# Patient Record
Sex: Male | Born: 1975 | Race: White | Hispanic: No | Marital: Single
Health system: Southern US, Community
[De-identification: ages and names within clinical notes are randomized; demographics above are authoritative.]

---

## 2016-05-10 ENCOUNTER — Inpatient Hospital Stay
Admission: AD | Admit: 2016-05-10 | Discharge: 2016-06-30 | Disposition: E | Payer: Medicaid Other | Source: Ambulatory Visit | Attending: Internal Medicine | Admitting: Internal Medicine

## 2016-05-10 DIAGNOSIS — Z992 Dependence on renal dialysis: Secondary | ICD-10-CM

## 2016-05-10 DIAGNOSIS — I82C12 Acute embolism and thrombosis of left internal jugular vein: Secondary | ICD-10-CM

## 2016-05-10 DIAGNOSIS — Z4659 Encounter for fitting and adjustment of other gastrointestinal appliance and device: Secondary | ICD-10-CM

## 2016-05-10 DIAGNOSIS — Z0189 Encounter for other specified special examinations: Secondary | ICD-10-CM

## 2016-05-10 DIAGNOSIS — Z978 Presence of other specified devices: Secondary | ICD-10-CM

## 2016-05-10 DIAGNOSIS — T8131XA Disruption of external operation (surgical) wound, not elsewhere classified, initial encounter: Secondary | ICD-10-CM

## 2016-05-10 DIAGNOSIS — Z931 Gastrostomy status: Secondary | ICD-10-CM

## 2016-05-10 DIAGNOSIS — T8579XA Infection and inflammatory reaction due to other internal prosthetic devices, implants and grafts, initial encounter: Secondary | ICD-10-CM

## 2016-05-10 DIAGNOSIS — N186 End stage renal disease: Secondary | ICD-10-CM

## 2016-05-10 DIAGNOSIS — J189 Pneumonia, unspecified organism: Secondary | ICD-10-CM

## 2016-05-10 DIAGNOSIS — J9690 Respiratory failure, unspecified, unspecified whether with hypoxia or hypercapnia: Secondary | ICD-10-CM

## 2016-05-10 DIAGNOSIS — K651 Peritoneal abscess: Secondary | ICD-10-CM

## 2016-05-10 NOTE — Consult Note (Signed)
Date: 05/16/2016                  Patient Name:  Steven Hines  MRN: [id number]  DOB: [birth year]  Age / Sex: 41 y.o., male         PCP: Pcp Not In System                 Service Requesting Consult: IM [facility]. Dr Christella Hartigan                 Reason for Consult: ESRD            History of Present Illness: Patient is a 41 y.o. male with medical problems of ESRD, DM-2, PVD, HTN, who was admitted to [facility] on 05/26/2016 for ongoing care. Patient has been on dialysis for 4 years.  Lately he was doing PD. He was admitted to outside hospital post fall as a result of hypoglycemia. He suffered from fracture of Tibia and Fibula of rt leg. It is being managed conservatively.   He was also noted to have abdominal abscess by CT scan. Two intra-abdominal drains were placed and PD cathter was removed. He was started on HD  Last HD was this morning. Nephrology asked to evaluate for HD  Medications: Outpatient medications: No prescriptions prior to admission.    Current medications: No current facility-administered medications for this encounter.    reviewed medex   Allergies: Allergies not on file    Past Medical History: No past medical history on file.   Past Surgical History: No past surgical history on file.   Family History: No family history on file.   Social History: Social History   Social History  . Marital status: Single    Spouse name: N/A  . Number of children: N/A  . Years of education: N/A   Occupational History  . Not on file.   Social History Main Topics  . Smoking status: Not on file  . Smokeless tobacco: Not on file  . Alcohol use Not on file  . Drug use: Unknown  . Sexual activity: Not on file   Other Topics Concern  . Not on file   Social History Narrative  . No narrative on file     Review of Systems: Gen: no fever chills, + weakness HEENT: denies acute c/o CV: denies chest pain or SOB/ no h/o MI Resp: denies acute c/o GI: no c/o GU : no  c/o.   MS:  Rt leg fracture Derm:  rt toe ischemic changes, Psych: no c/o Heme: no c/o Neuro: no c/o  Endocrine. No acute c/o   Vital Signs: T 97.8 BP 165/99  RR 22  HR 95  sats 94% 2 L [place]  Weight trends: There were no vitals filed for this visit.  Physical Exam: General:  NAD, laying in bed  HEENT Periorbital edema, anicteric, moist oral mucus membranes  Neck:  supple  Lungs: Coarse breath sounds, normal edffort, 2 L [place] O2  Heart::  regular, no rub  Abdomen: Soft, distended, LLQ drain, RUQ drain  Extremities:  no edema  Neurologic: Alert, answers a few questions  Skin: Rt shin large necrotic area, rt big toe ischemic changes, tip of rt middle finger ischemic changes  Access:   Foley:        Lab results: Basic Metabolic Panel: No results for input(s): NA, K, CL, CO2, GLUCOSE, BUN, CREATININE, CALCIUM, MG, PHOS in the last 168 hours.  Liver Function Tests: No results for  input(s): AST, ALT, ALKPHOS, BILITOT, PROT, ALBUMIN in the last 168 hours. No results for input(s): LIPASE, AMYLASE in the last 168 hours. No results for input(s): AMMONIA in the last 168 hours.  CBC: No results for input(s): WBC, NEUTROABS, HGB, HCT, MCV, PLT in the last 168 hours.  Cardiac Enzymes: No results for input(s): CKTOTAL, TROPONINI in the last 168 hours.  BNP: Invalid input(s): POCBNP  CBG: No results for input(s): GLUCAP in the last 168 hours.  Microbiology: No results found for this or any previous visit (from the past 720 hour(s)).   Coagulation Studies: No results for input(s): LABPROT, INR in the last 72 hours.  Urinalysis: No results for input(s): COLORURINE, LABSPEC, PHURINE, GLUCOSEU, HGBUR, BILIRUBINUR, KETONESUR, PROTEINUR, UROBILINOGEN, NITRITE, LEUKOCYTESUR in the last 72 hours.  Invalid input(s): APPERANCEUR      Imaging:  No results found.   Assessment & Plan: Pt is a 41 y.o. WM male with ESRD, on dialysis for 4 years, DM-2, HTN, severe PVD, was admitted  on 04/30/2016. Presented with rt tibia fib fracture. Conservative management. Hospital course complicated by intra-abdominal abscess; PD catheter has been removed  1. ESRD- now HD; MWF 2. AOCKD 3. SHPTH 4. DM-2 with CKD 5. HTN  Plan: Dialysis MWF Aranesp Iron studies Abx as per Primary team Management of ischemia as per primary team Monitor phos with dialysis  Next HD on Friday

## 2016-05-11 ENCOUNTER — Other Ambulatory Visit (HOSPITAL_COMMUNITY): Payer: Medicaid Other

## 2016-05-11 LAB — CBC
HCT: 30.8 % — ABNORMAL LOW (ref 39.0–52.0)
HEMOGLOBIN: 9.4 g/dL — AB (ref 13.0–17.0)
MCH: 28.1 pg (ref 26.0–34.0)
MCHC: 30.5 g/dL (ref 30.0–36.0)
MCV: 91.9 fL (ref 78.0–100.0)
Platelets: 415 10*3/uL — ABNORMAL HIGH (ref 150–400)
RBC: 3.35 MIL/uL — AB (ref 4.22–5.81)
RDW: 19.7 % — ABNORMAL HIGH (ref 11.5–15.5)
WBC: 17.2 10*3/uL — ABNORMAL HIGH (ref 4.0–10.5)

## 2016-05-11 LAB — COMPREHENSIVE METABOLIC PANEL
ALT: 13 U/L — ABNORMAL LOW (ref 17–63)
AST: 26 U/L (ref 15–41)
Albumin: 1.5 g/dL — ABNORMAL LOW (ref 3.5–5.0)
Alkaline Phosphatase: 181 U/L — ABNORMAL HIGH (ref 38–126)
Anion gap: 12 (ref 5–15)
BILIRUBIN TOTAL: 0.3 mg/dL (ref 0.3–1.2)
BUN: 30 mg/dL — ABNORMAL HIGH (ref 6–20)
CHLORIDE: 95 mmol/L — AB (ref 101–111)
CO2: 29 mmol/L (ref 22–32)
CREATININE: 4.7 mg/dL — AB (ref 0.61–1.24)
Calcium: 8.8 mg/dL — ABNORMAL LOW (ref 8.9–10.3)
GFR, EST AFRICAN AMERICAN: 16 mL/min — AB (ref >60)
GFR, EST NON AFRICAN AMERICAN: 14 mL/min — AB (ref >60)
GLUCOSE: 55 mg/dL — AB (ref 65–99)
Potassium: 4.2 mmol/L (ref 3.5–5.1)
Sodium: 136 mmol/L (ref 135–145)
Total Protein: 6.3 g/dL — ABNORMAL LOW (ref 6.5–8.1)

## 2016-05-11 LAB — BASIC METABOLIC PANEL
ANION GAP: 10 (ref 5–15)
BUN: 33 mg/dL — ABNORMAL HIGH (ref 6–20)
CHLORIDE: 95 mmol/L — AB (ref 101–111)
CO2: 30 mmol/L (ref 22–32)
Calcium: 8.7 mg/dL — ABNORMAL LOW (ref 8.9–10.3)
Creatinine, Ser: 5.06 mg/dL — ABNORMAL HIGH (ref 0.61–1.24)
GFR calc non Af Amer: 13 mL/min — ABNORMAL LOW (ref >60)
GFR, EST AFRICAN AMERICAN: 15 mL/min — AB (ref >60)
Glucose, Bld: 101 mg/dL — ABNORMAL HIGH (ref 65–99)
Potassium: 4.3 mmol/L (ref 3.5–5.1)
Sodium: 135 mmol/L (ref 135–145)

## 2016-05-11 LAB — PROTIME-INR
INR: 1.18
Prothrombin Time: 15.1 seconds (ref 11.4–15.2)

## 2016-05-12 LAB — CBC
HEMATOCRIT: 28.7 % — AB (ref 39.0–52.0)
Hemoglobin: 8.7 g/dL — ABNORMAL LOW (ref 13.0–17.0)
MCH: 27.9 pg (ref 26.0–34.0)
MCHC: 30.3 g/dL (ref 30.0–36.0)
MCV: 92 fL (ref 78.0–100.0)
Platelets: 411 10*3/uL — ABNORMAL HIGH (ref 150–400)
RBC: 3.12 MIL/uL — ABNORMAL LOW (ref 4.22–5.81)
RDW: 19.8 % — AB (ref 11.5–15.5)
WBC: 16.5 10*3/uL — AB (ref 4.0–10.5)

## 2016-05-12 LAB — RENAL FUNCTION PANEL
Albumin: 1.5 g/dL — ABNORMAL LOW (ref 3.5–5.0)
Anion gap: 11 (ref 5–15)
BUN: 38 mg/dL — ABNORMAL HIGH (ref 6–20)
CHLORIDE: 93 mmol/L — AB (ref 101–111)
CO2: 29 mmol/L (ref 22–32)
Calcium: 8.8 mg/dL — ABNORMAL LOW (ref 8.9–10.3)
Creatinine, Ser: 5.76 mg/dL — ABNORMAL HIGH (ref 0.61–1.24)
GFR calc Af Amer: 13 mL/min — ABNORMAL LOW (ref >60)
GFR, EST NON AFRICAN AMERICAN: 11 mL/min — AB (ref >60)
Glucose, Bld: 71 mg/dL (ref 65–99)
POTASSIUM: 4.6 mmol/L (ref 3.5–5.1)
Phosphorus: 6.2 mg/dL — ABNORMAL HIGH (ref 2.5–4.6)
Sodium: 133 mmol/L — ABNORMAL LOW (ref 135–145)

## 2016-05-12 LAB — IRON AND TIBC
IRON: 20 ug/dL — AB (ref 45–182)
SATURATION RATIOS: 16 % — AB (ref 17.9–39.5)
TIBC: 125 ug/dL — ABNORMAL LOW (ref 250–450)
UIBC: 105 ug/dL

## 2016-05-12 LAB — HEPATITIS B CORE ANTIBODY, TOTAL: HEP B C TOTAL AB: NEGATIVE

## 2016-05-12 LAB — HEPATITIS B SURFACE ANTIBODY,QUALITATIVE: Hep B S Ab: REACTIVE

## 2016-05-12 LAB — HEPATITIS B SURFACE ANTIGEN: HEP B S AG: NEGATIVE

## 2016-05-12 LAB — TRANSFERRIN: Transferrin: 89 mg/dL — ABNORMAL LOW (ref 180–329)

## 2016-05-12 LAB — FERRITIN: Ferritin: 658 ng/mL — ABNORMAL HIGH (ref 24–336)

## 2016-05-12 NOTE — Progress Notes (Signed)
  Subjective:  Patient is doing fair No acute complaints dialysis to be done later today   Objective:  Vital signs in last 24 hours:   T 97.6  P 86  R 20  bp 157/98    Intake/Output:   No intake or output data in the 24 hours ending 05/12/16 1635   Physical Exam: General: Sitting up in bed, NAD  HEENT Conjunctival edema  Neck supple  Pulm/lungs Coarse breath sounds, [place] O2  CVS/Heart Regular, no rub  Abdomen:  Soft, mildly distended, LLQ drains, RUQ drain  Extremities: Rt leg bandaged  Neurologic: Alert, oriented  Skin: Rt middle finger tip and Rt big toe ischemic changes  Access: Rt IJ PC       Basic Metabolic Panel:   Recent Labs Lab 05/11/16 0617 05/11/16 1457 05/12/16 0539  NA 136 135 133*  K 4.2 4.3 4.6  CL 95* 95* 93*  CO2 GLUCOSE 55* 101* 71  BUN 30* 33* 38*  CREATININE 4.70* 5.06* 5.76*  CALCIUM 8.8* 8.7* 8.8*  PHOS  --   --  6.2*     CBC:  Recent Labs Lab 05/11/16 0617 05/12/16 0539  WBC 17.2* 16.5*  HGB 9.4* 8.7*  HCT 30.8* 28.7*  MCV 91.9 92.0  PLT 415* 411*      Microbiology:  No results found for this or any previous visit (from the past 720 hour(s)).  Coagulation Studies:  Recent Labs  05/11/16 0617  LABPROT 15.1  INR 1.18    Urinalysis: No results for input(s): COLORURINE, LABSPEC, PHURINE, GLUCOSEU, HGBUR, BILIRUBINUR, KETONESUR, PROTEINUR, UROBILINOGEN, NITRITE, LEUKOCYTESUR in the last 72 hours.  Invalid input(s): APPERANCEUR    Imaging: Dg Chest Port 1 View  Result Date: 05/11/2016 CLINICAL DATA:  Respiratory failure, end-stage renal disease, diabetes. EXAM: PORTABLE CHEST 1 VIEW COMPARISON:  None in PACs FINDINGS: The lungs are reasonably well inflated. There is a moderate size left pleural effusion with smaller right pleural effusion. The interstitial markings are both lungs are increased. The pulmonary vascularity is engorged. The cardiac silhouette is top-normal in size. The retrocardiac region on  the left is dense. There is calcification in the wall of the aortic arch. The dialysis catheter tip projects over the junction of the middle and distal thirds of the SVC. IMPRESSION: CHF with interstitial edema and bilateral pleural effusions. Probable left lower lobe atelectasis. Thoracic aortic atherosclerosis. Electronically Signed   By: David  [place] M.D.   On: 05/11/2016 07:24     Medications:       Assessment/ Plan:  41 y.o.White  male with ESRD, on dialysis for 4 years, DM-2, HTN, severe PVD, was admitted on 05-22-16. Presented with rt tibia fib fracture. Conservative management. Hospital course complicated by intra-abdominal abscess; PD catheter has been removed  1. ESRD- now HD; MWF 2. AOCKD 3. SHPTH 4. DM-2 with CKD 5. HTN  Plan: Dialysis MWF Aranesp 60 weekly Iron studies Abx as per Primary team Management of ischemia as per primary team Monitor phos with dialysis sevelamer 2400 TID    LOS: 0 Steven Hines 4/13/20184:35 PM

## 2016-05-13 LAB — CBC
HCT: 26.2 % — ABNORMAL LOW (ref 39.0–52.0)
HEMOGLOBIN: 8.1 g/dL — AB (ref 13.0–17.0)
MCH: 28.6 pg (ref 26.0–34.0)
MCHC: 30.9 g/dL (ref 30.0–36.0)
MCV: 92.6 fL (ref 78.0–100.0)
PLATELETS: 350 10*3/uL (ref 150–400)
RBC: 2.83 MIL/uL — AB (ref 4.22–5.81)
RDW: 20 % — ABNORMAL HIGH (ref 11.5–15.5)
WBC: 15.2 10*3/uL — AB (ref 4.0–10.5)

## 2016-05-15 LAB — RENAL FUNCTION PANEL
ALBUMIN: 1.6 g/dL — AB (ref 3.5–5.0)
ANION GAP: 18 — AB (ref 5–15)
BUN: 53 mg/dL — AB (ref 6–20)
CO2: 23 mmol/L (ref 22–32)
Calcium: 8.9 mg/dL (ref 8.9–10.3)
Chloride: 91 mmol/L — ABNORMAL LOW (ref 101–111)
Creatinine, Ser: 6.4 mg/dL — ABNORMAL HIGH (ref 0.61–1.24)
GFR calc Af Amer: 11 mL/min — ABNORMAL LOW (ref >60)
GFR, EST NON AFRICAN AMERICAN: 10 mL/min — AB (ref >60)
Glucose, Bld: 141 mg/dL — ABNORMAL HIGH (ref 65–99)
PHOSPHORUS: 7.4 mg/dL — AB (ref 2.5–4.6)
POTASSIUM: 5.2 mmol/L — AB (ref 3.5–5.1)
Sodium: 132 mmol/L — ABNORMAL LOW (ref 135–145)

## 2016-05-15 LAB — CBC
HEMATOCRIT: 27.8 % — AB (ref 39.0–52.0)
Hemoglobin: 8.5 g/dL — ABNORMAL LOW (ref 13.0–17.0)
MCH: 27.8 pg (ref 26.0–34.0)
MCHC: 30.6 g/dL (ref 30.0–36.0)
MCV: 90.8 fL (ref 78.0–100.0)
PLATELETS: 385 10*3/uL (ref 150–400)
RBC: 3.06 MIL/uL — ABNORMAL LOW (ref 4.22–5.81)
RDW: 19.6 % — AB (ref 11.5–15.5)
WBC: 17.2 10*3/uL — AB (ref 4.0–10.5)

## 2016-05-15 LAB — PROCALCITONIN: Procalcitonin: 4.23 ng/mL

## 2016-05-15 NOTE — Progress Notes (Signed)
  Subjective:  Patient completed hemodialysis today. Ultrafiltration achieved was 2.7 kg. Patient's mother at bedside.   Objective:  Vital signs in last 24 hours:  Temperature 97.7 pulse 89 respirations 20 blood pressure 158/94    Physical Exam: General: Sitting up in bed, NAD  HEENT Periorbital edema noted  Neck supple  Pulm/lungs Coarse breath sounds, [place] O2  CVS/Heart Regular, no rub  Abdomen:  Soft, mildly distended, LLQ drains, RUQ drain  Extremities: Rt leg bandaged, trace edema LLE  Neurologic: Alert, confused at times, follows simple commands  Skin: No visible rash  Access: Rt IJ PC       Basic Metabolic Panel:   Recent Labs Lab 05/11/16 0617 05/11/16 1457 05/12/16 0539 05/15/16 0533  NA 136 135 133* 132*  K 4.2 4.3 4.6 5.2*  CL 95* 95* 93* 91*  CO2 GLUCOSE 55* 101* 71 141*  BUN 30* 33* 38* 53*  CREATININE 4.70* 5.06* 5.76* 6.40*  CALCIUM 8.8* 8.7* 8.8* 8.9  PHOS  --   --  6.2* 7.4*     CBC:  Recent Labs Lab 05/11/16 0617 05/12/16 0539 05/13/16 0522 05/15/16 0533  WBC 17.2* 16.5* 15.2* 17.2*  HGB 9.4* 8.7* 8.1* 8.5*  HCT 30.8* 28.7* 26.2* 27.8*  MCV 91.9 92.0 92.6 90.8  PLT 415* 411* 350 385      Microbiology:  No results found for this or any previous visit (from the past 720 hour(s)).  Coagulation Studies: No results for input(s): LABPROT, INR in the last 72 hours.  Urinalysis: No results for input(s): COLORURINE, LABSPEC, PHURINE, GLUCOSEU, HGBUR, BILIRUBINUR, KETONESUR, PROTEINUR, UROBILINOGEN, NITRITE, LEUKOCYTESUR in the last 72 hours.  Invalid input(s): APPERANCEUR    Imaging: No results found.   Medications:       Assessment/ Plan:  41 y.o.White  male with ESRD, on dialysis for 4 years, DM-2, HTN, severe PVD, was admitted on 05/13/2016. Presented with rt tibia fib fracture. Conservative management. Hospital course complicated by intra-abdominal abscess; PD catheter has been removed  1. ESRD- now HD;  MWF 2. AOCKD, hgb 8.5 3. SHPTH, phos 7.4, on renvela 2400 TID 4. DM-2 with CKD 5. HTN  Plan: Patient seen post hemodialysis. Ultrafiltration she was 2.7 kg. We will plan for the next dialysis treatment on Wednesday. Continue Aranesp for management of his anemia of chronic kidney disease. Phosphorus remains quite high at 7.4. Patient is on Renagel 2.4 g by mouth 3 times a day We will continue to monitor bone metabolism parameters.Otherwisemanagement of his chronic medical conditions per hospitalist.    LOS: 0 Yariela Tison 4/16/20182:54 PM

## 2016-05-17 LAB — RENAL FUNCTION PANEL
ANION GAP: 15 (ref 5–15)
Albumin: 1.4 g/dL — ABNORMAL LOW (ref 3.5–5.0)
BUN: 47 mg/dL — AB (ref 6–20)
CHLORIDE: 93 mmol/L — AB (ref 101–111)
CO2: 24 mmol/L (ref 22–32)
Calcium: 8.8 mg/dL — ABNORMAL LOW (ref 8.9–10.3)
Creatinine, Ser: 5.26 mg/dL — ABNORMAL HIGH (ref 0.61–1.24)
GFR calc Af Amer: 14 mL/min — ABNORMAL LOW (ref >60)
GFR calc non Af Amer: 12 mL/min — ABNORMAL LOW (ref >60)
GLUCOSE: 235 mg/dL — AB (ref 65–99)
PHOSPHORUS: 7.2 mg/dL — AB (ref 2.5–4.6)
POTASSIUM: 4.4 mmol/L (ref 3.5–5.1)
Sodium: 132 mmol/L — ABNORMAL LOW (ref 135–145)

## 2016-05-17 LAB — CBC
HEMATOCRIT: 28.6 % — AB (ref 39.0–52.0)
HEMOGLOBIN: 8.8 g/dL — AB (ref 13.0–17.0)
MCH: 27.7 pg (ref 26.0–34.0)
MCHC: 30.8 g/dL (ref 30.0–36.0)
MCV: 89.9 fL (ref 78.0–100.0)
Platelets: 404 10*3/uL — ABNORMAL HIGH (ref 150–400)
RBC: 3.18 MIL/uL — ABNORMAL LOW (ref 4.22–5.81)
RDW: 19.1 % — AB (ref 11.5–15.5)
WBC: 14 10*3/uL — ABNORMAL HIGH (ref 4.0–10.5)

## 2016-05-17 NOTE — Progress Notes (Signed)
  Subjective:  Patient seen at bedside. He just completed hemodialysis. Ultrafiltration achieved was 2.6 kg.   Objective:  Vital signs in last 24 hours:  Temperature 97.8 pulse 80 respirations 16 blood pressure 110/76    Physical Exam: General: Sitting up in chair, NAD  HEENT Periorbital edema noted  Neck supple  Pulm/lungs Coarse breath sounds, normal effort  CVS/Heart Regular, no rub  Abdomen:  Soft, mildly distended, LLQ drains, RUQ drain  Extremities: Rt leg bandaged, trace edema LLE  Neurologic: Alert, confused, will follow simple commands  Skin: Necrotic finger tip R hand  Access: Rt IJ PC       Basic Metabolic Panel:   Recent Labs Lab 05/11/16 0617 05/11/16 1457 05/12/16 0539 05/15/16 0533 05/17/16 0548  NA 136 135 133* 132* 132*  K 4.2 4.3 4.6 5.2* 4.4  CL 95* 95* 93* 91* 93*  CO2 GLUCOSE 55* 101* 71 141* 235*  BUN 30* 33* 38* 53* 47*  CREATININE 4.70* 5.06* 5.76* 6.40* 5.26*  CALCIUM 8.8* 8.7* 8.8* 8.9 8.8*  PHOS  --   --  6.2* 7.4* 7.2*     CBC:  Recent Labs Lab 05/11/16 0617 05/12/16 0539 05/13/16 0522 05/15/16 0533 05/17/16 0548  WBC 17.2* 16.5* 15.2* 17.2* 14.0*  HGB 9.4* 8.7* 8.1* 8.5* 8.8*  HCT 30.8* 28.7* 26.2* 27.8* 28.6*  MCV 91.9 92.0 92.6 90.8 89.9  PLT 415* 411* 350 385 404*      Microbiology:  No results found for this or any previous visit (from the past 720 hour(s)).  Coagulation Studies: No results for input(s): LABPROT, INR in the last 72 hours.  Urinalysis: No results for input(s): COLORURINE, LABSPEC, PHURINE, GLUCOSEU, HGBUR, BILIRUBINUR, KETONESUR, PROTEINUR, UROBILINOGEN, NITRITE, LEUKOCYTESUR in the last 72 hours.  Invalid input(s): APPERANCEUR    Imaging: No results found.   Medications:       Assessment/ Plan:  41 y.o.White  male with ESRD, on dialysis for 4 years, DM-2, HTN, severe PVD, was admitted on Jun 05, 2016. Presented with rt tibia fib fracture. Conservative management.  Hospital course complicated by intra-abdominal abscess; PD catheter has been removed  1. ESRD- now HD; MWF 2. AOCKD, hgb 8.8 3. SHPTH, phos 7.4, on renvela 2400 TID 4. DM-2 with CKD 5. HTN  Plan: Patient completed hemodialysis today. Ultrafiltration she was 2.6 kg. We will plan for dialysis again on Friday. Ultrafiltration target then will be 2.5 kg. Hemoglobin up to 8.8. Continue to monitor CBC periodically.  Malen Gauze still a bit high at 7.2. We willrecheck phosphorus on Friday as well. Otherwise continue Enbrel for now.    LOS: 0 Derriona Branscom 4/18/20183:54 PM

## 2016-05-19 LAB — CBC
HEMATOCRIT: 28 % — AB (ref 39.0–52.0)
HEMOGLOBIN: 8.6 g/dL — AB (ref 13.0–17.0)
MCH: 27.7 pg (ref 26.0–34.0)
MCHC: 30.7 g/dL (ref 30.0–36.0)
MCV: 90 fL (ref 78.0–100.0)
Platelets: 297 10*3/uL (ref 150–400)
RBC: 3.11 MIL/uL — AB (ref 4.22–5.81)
RDW: 19 % — ABNORMAL HIGH (ref 11.5–15.5)
WBC: 13.8 10*3/uL — ABNORMAL HIGH (ref 4.0–10.5)

## 2016-05-19 LAB — RENAL FUNCTION PANEL
ALBUMIN: 1.4 g/dL — AB (ref 3.5–5.0)
ANION GAP: 14 (ref 5–15)
BUN: 49 mg/dL — ABNORMAL HIGH (ref 6–20)
CALCIUM: 9.1 mg/dL (ref 8.9–10.3)
CO2: 26 mmol/L (ref 22–32)
Chloride: 97 mmol/L — ABNORMAL LOW (ref 101–111)
Creatinine, Ser: 5.53 mg/dL — ABNORMAL HIGH (ref 0.61–1.24)
GFR, EST AFRICAN AMERICAN: 13 mL/min — AB (ref >60)
GFR, EST NON AFRICAN AMERICAN: 12 mL/min — AB (ref >60)
GLUCOSE: 147 mg/dL — AB (ref 65–99)
PHOSPHORUS: 6.6 mg/dL — AB (ref 2.5–4.6)
POTASSIUM: 4.6 mmol/L (ref 3.5–5.1)
Sodium: 137 mmol/L (ref 135–145)

## 2016-05-19 NOTE — Progress Notes (Signed)
  Subjective:  Patient is due for hemodialysis today. Orders have been prepared. Patient remains confused. This morning he believes that he is in [place].   Objective:  Vital signs in last 24 hours:  Pulse 76 respirations 18 blood pressure 137/93    Physical Exam: General: Laying in bed, NAD  HEENT Periorbital edema noted  Neck supple  Pulm/lungs Coarse breath sounds, normal effort  CVS/Heart Regular, no rub  Abdomen:  Soft, mild distension, BS present  Extremities: Rt leg bandaged, trace edema LLE  Neurologic: Alert, confused, will follow simple commands  Skin: Necrotic finger tip right 3rd finger  Access: Rt IJ PC       Basic Metabolic Panel:   Recent Labs Lab 05/15/16 0533 05/17/16 0548 05/19/16 0639  NA 132* 132* 137  K 5.2* 4.4 4.6  CL 91* 93* 97*  CO2 GLUCOSE 141* 235* 147*  BUN 53* 47* 49*  CREATININE 6.40* 5.26* 5.53*  CALCIUM 8.9 8.8* 9.1  PHOS 7.4* 7.2* 6.6*     CBC:  Recent Labs Lab 05/13/16 0522 05/15/16 0533 05/17/16 0548 05/19/16 0639  WBC 15.2* 17.2* 14.0* 13.8*  HGB 8.1* 8.5* 8.8* 8.6*  HCT 26.2* 27.8* 28.6* 28.0*  MCV 92.6 90.8 89.9 90.0  PLT 350 385 404* 297      Microbiology:  No results found for this or any previous visit (from the past 720 hour(s)).  Coagulation Studies: No results for input(s): LABPROT, INR in the last 72 hours.  Urinalysis: No results for input(s): COLORURINE, LABSPEC, PHURINE, GLUCOSEU, HGBUR, BILIRUBINUR, KETONESUR, PROTEINUR, UROBILINOGEN, NITRITE, LEUKOCYTESUR in the last 72 hours.  Invalid input(s): APPERANCEUR    Imaging: No results found.   Medications:       Assessment/ Plan:  41 y.o.White  male with ESRD, on dialysis for 4 years, DM-2, HTN, severe PVD, was admitted on 05/12/2016. Presented with rt tibia fib fracture. Conservative management. Hospital course complicated by intra-abdominal abscess; PD catheter has been removed  1. ESRD- now HD; MWF 2. AOCKD, hgb  8.6 3. SHPTH, phos down to 6.6, on renvela 2400 TID 4. DM-2 with CKD 5. HTN  Plan: Patient is due for hemodialysis today. Orders have been prepared. We will plan for dialysis again on Monday. Hemoglobin currently 8.6 which we will continue to monitor. Phosphorus has improved slightly down to 6.6. Repeat phosphorus on Monday. Otherwise continue nutritional support and physical therapy. Overall patient has a guarded prognosis.    LOS: 0 Jolicia Delira 4/20/20188:05 AM

## 2016-05-22 LAB — RENAL FUNCTION PANEL
ALBUMIN: 1.4 g/dL — AB (ref 3.5–5.0)
ANION GAP: 11 (ref 5–15)
BUN: 62 mg/dL — ABNORMAL HIGH (ref 6–20)
CO2: 23 mmol/L (ref 22–32)
Calcium: 8.8 mg/dL — ABNORMAL LOW (ref 8.9–10.3)
Chloride: 99 mmol/L — ABNORMAL LOW (ref 101–111)
Creatinine, Ser: 6.44 mg/dL — ABNORMAL HIGH (ref 0.61–1.24)
GFR calc Af Amer: 11 mL/min — ABNORMAL LOW (ref >60)
GFR, EST NON AFRICAN AMERICAN: 10 mL/min — AB (ref >60)
Glucose, Bld: 173 mg/dL — ABNORMAL HIGH (ref 65–99)
PHOSPHORUS: 6.1 mg/dL — AB (ref 2.5–4.6)
POTASSIUM: 6.1 mmol/L — AB (ref 3.5–5.1)
Sodium: 133 mmol/L — ABNORMAL LOW (ref 135–145)

## 2016-05-22 LAB — CBC
HEMATOCRIT: 25.7 % — AB (ref 39.0–52.0)
HEMOGLOBIN: 8.2 g/dL — AB (ref 13.0–17.0)
MCH: 29.1 pg (ref 26.0–34.0)
MCHC: 31.9 g/dL (ref 30.0–36.0)
MCV: 91.1 fL (ref 78.0–100.0)
Platelets: 281 10*3/uL (ref 150–400)
RBC: 2.82 MIL/uL — AB (ref 4.22–5.81)
RDW: 19.5 % — AB (ref 11.5–15.5)
WBC: 13.2 10*3/uL — AB (ref 4.0–10.5)

## 2016-05-22 NOTE — Progress Notes (Signed)
  Subjective:   Patient seen during dialysis BFR poor as catheter is positional Patient denies any complaints Sitting in chair for HD   Objective:  Vital signs in last 24 hours:  Temperature 98, pulse 80, blood pressure 128/91    Physical Exam: General: NAD  HEENT Periorbital edema noted  Neck supple  Pulm/lungs Coarse breath sounds, normal effort  CVS/Heart Regular, no rub  Abdomen:  Soft, mild distension   Extremities: Rt leg bandaged, trace edema LLE  Neurologic: Alert,   will follow simple commands  Skin: Necrotic finger tip right 3rd finger  Access: Rt IJ PC       Basic Metabolic Panel:   Recent Labs Lab 05/17/16 0548 05/19/16 0639 05/22/16 0758  NA 132* 137 133*  K 4.4 4.6 6.1*  CL 93* 97* 99*  CO2 GLUCOSE 235* 147* 173*  BUN 47* 49* 62*  CREATININE 5.26* 5.53* 6.44*  CALCIUM 8.8* 9.1 8.8*  PHOS 7.2* 6.6* 6.1*     CBC:  Recent Labs Lab 05/17/16 0548 05/19/16 0639 05/22/16 0758  WBC 14.0* 13.8* 13.2*  HGB 8.8* 8.6* 8.2*  HCT 28.6* 28.0* 25.7*  MCV 89.9 90.0 91.1  PLT 404* 297 281      Microbiology:  No results found for this or any previous visit (from the past 720 hour(s)).  Coagulation Studies: No results for input(s): LABPROT, INR in the last 72 hours.  Urinalysis: No results for input(s): COLORURINE, LABSPEC, PHURINE, GLUCOSEU, HGBUR, BILIRUBINUR, KETONESUR, PROTEINUR, UROBILINOGEN, NITRITE, LEUKOCYTESUR in the last 72 hours.  Invalid input(s): APPERANCEUR    Imaging: No results found.   Medications:       Assessment/ Plan:  41 y.o.White  male with ESRD, on dialysis for 4 years, DM-2, HTN, severe PVD, was admitted on 05/03/2016. Presented with rt tibia fib fracture. Conservative management. Hospital course complicated by intra-abdominal abscess; PD catheter has been removed  1. ESRD- now HD; MWF 2. AOCKD,  3. SHPTH,  on renvela 2400 TID 4. DM-2 with CKD 5. HTN 6. Hyperkalemia  Plan: Continue dialysis  Monday, Wednesday, Friday Hemoglobin currently 8.2 which we will continue to monitor. Phosphorus has improved slightly down to 6.1. Repeat phosphorus periodically. Otherwise continue nutritional support and physical therapy. Overall patient has a guarded prognosis.    LOS: 0 Kaitlynd Phillips 4/23/201811:36 AM

## 2016-05-23 LAB — AMMONIA: AMMONIA: 25 umol/L (ref 9–35)

## 2016-05-24 DIAGNOSIS — N185 Chronic kidney disease, stage 5: Secondary | ICD-10-CM

## 2016-05-24 LAB — CBC
HCT: 24.5 % — ABNORMAL LOW (ref 39.0–52.0)
Hemoglobin: 7.6 g/dL — ABNORMAL LOW (ref 13.0–17.0)
MCH: 28.5 pg (ref 26.0–34.0)
MCHC: 31 g/dL (ref 30.0–36.0)
MCV: 91.8 fL (ref 78.0–100.0)
PLATELETS: 292 10*3/uL (ref 150–400)
RBC: 2.67 MIL/uL — ABNORMAL LOW (ref 4.22–5.81)
RDW: 19.3 % — AB (ref 11.5–15.5)
WBC: 11 10*3/uL — ABNORMAL HIGH (ref 4.0–10.5)

## 2016-05-24 LAB — RENAL FUNCTION PANEL
ALBUMIN: 1.5 g/dL — AB (ref 3.5–5.0)
Anion gap: 15 (ref 5–15)
BUN: 78 mg/dL — AB (ref 6–20)
CALCIUM: 9.1 mg/dL (ref 8.9–10.3)
CO2: 22 mmol/L (ref 22–32)
Chloride: 97 mmol/L — ABNORMAL LOW (ref 101–111)
Creatinine, Ser: 7.29 mg/dL — ABNORMAL HIGH (ref 0.61–1.24)
GFR calc Af Amer: 10 mL/min — ABNORMAL LOW (ref >60)
GFR calc non Af Amer: 8 mL/min — ABNORMAL LOW (ref >60)
GLUCOSE: 313 mg/dL — AB (ref 65–99)
PHOSPHORUS: 6.6 mg/dL — AB (ref 2.5–4.6)
Potassium: 5.6 mmol/L — ABNORMAL HIGH (ref 3.5–5.1)
SODIUM: 134 mmol/L — AB (ref 135–145)

## 2016-05-24 NOTE — Progress Notes (Signed)
  Subjective:   Patient seen during dialysis BFR 400 Patient denies any complaints Sitting in chair for HD   Objective:  Vital signs in last 24 hours:  Temperature 97.8, pulse 86, respirations 20, blood pressure 133/83    Physical Exam: General: NAD  HEENT Periorbital edema noted- improving  Neck supple  Pulm/lungs Coarse breath sounds, normal effort  CVS/Heart Regular, no rub  Abdomen:  Soft, mild distension   Extremities: Rt leg bandaged, trace edema LLE  Neurologic: Alert,   will follow simple commands  Skin: Necrotic finger tip right 3rd finger  Access: Rt IJ PC       Basic Metabolic Panel:   Recent Labs Lab 05/19/16 0639 05/22/16 0758 05/24/16 0843  NA 137 133* 134*  K 4.6 6.1* 5.6*  CL 97* 99* 97*  CO2 GLUCOSE 147* 173* 313*  BUN 49* 62* 78*  CREATININE 5.53* 6.44* 7.29*  CALCIUM 9.1 8.8* 9.1  PHOS 6.6* 6.1* 6.6*     CBC:  Recent Labs Lab 05/19/16 0639 05/22/16 0758 05/24/16 0843  WBC 13.8* 13.2* 11.0*  HGB 8.6* 8.2* 7.6*  HCT 28.0* 25.7* 24.5*  MCV 90.0 91.1 91.8  PLT 297 281 292      Microbiology:  No results found for this or any previous visit (from the past 720 hour(s)).  Coagulation Studies: No results for input(s): LABPROT, INR in the last 72 hours.  Urinalysis: No results for input(s): COLORURINE, LABSPEC, PHURINE, GLUCOSEU, HGBUR, BILIRUBINUR, KETONESUR, PROTEINUR, UROBILINOGEN, NITRITE, LEUKOCYTESUR in the last 72 hours.  Invalid input(s): APPERANCEUR    Imaging: No results found.   Medications:    Amlodipine 10 mg daily Aranesp 60 mcg weekly Carvedilol 37.5 twice a day Heparin subcutaneous Hydralazine 50 every 8 hours Labetalol 200 twice a day Lantus 5 units each bedtime Losartan 50 mg daily sevelamer 2400 mg   Assessment/ Plan:  41 y.o.White  male with ESRD, on dialysis for 4 years, DM-2, HTN, severe PVD, was admitted on 05/24/2016. Presented with rt tibia fib fracture. Conservative management.  Hospital course complicated by intra-abdominal abscess; PD catheter has been removed  1. ESRD- now HD; MWF 2. AOCKD,  3. SHPTH,  on renvela 2400 TID 4. DM-2 with CKD 5. HTN 6. Hyperkalemia  Plan: Continue dialysis Monday, Wednesday, Friday Hemoglobin currently 7.6 which we will continue to monitor.  Phos Remains high Repeat phosphorus periodically. Recommend low phosphorus to 2 weeks. Continue sevelamer Multiple antihypertensives as noted Vascular evaluation for AV fistula placement Blood sugars remain poorly controlled Overall patient has a guarded prognosis.   LOS: 0 Deliah Strehlow 4/25/20185:06 PM

## 2016-05-24 NOTE — Progress Notes (Signed)
Patient ID: Steven Hines, male   DOB: 05/09/1975, 40 y.o.   MRN: [id number]                                       [facility] [place]  Patient name: Steven Hines MRN: [id number] DOB: 03/31/1975 Sex: male  REASON FOR VISIT: Discuss hemodialysis access  HPI: Steven Hines is a 40 y.o. male seen for discussion of hemodialysis access. He is a poor historian. He reports prostate 5 year history of peritoneal dialysis. Unclear as to the etiology of his dialysis requirement. He had an open tib-fib fracture and had surgical treatment at [facility]. Apparently had palpitations related to his perineal dialysis was converted to hemodialysis. He currently is being dialyzed via a right tunneled IJ catheter  No past medical history on file.  No family history on file.  SOCIAL HISTORY: Social History  Substance Use Topics  . Smoking status: Not on file  . Smokeless tobacco: Not on file  . Alcohol use Not on file    Allergies not on file  No current facility-administered medications for this encounter.        PHYSICAL EXAM: There were no vitals filed for this visit.  GENERAL: The patient is a well-nourished male, in no acute distress. The vital signs are documented above. CARDIOVASCULAR: Palpable radial pulses bilaterally. IV in his left wrist cephalic vein. Good caliber right cephalic vein in his forearm. PULMONARY: There is good air exchange  MUSCULOSKELETAL: There are no major deformities or cyanosis. NEUROLOGIC: No focal weakness or paresthesias are detected. SKIN: Skin necrosis on the tip of his right third finger. He denies pain with this PSYCHIATRIC: The patient has a normal affect.  DATA:  Vein mapping pending of both upper extremities  MEDICAL ISSUES: Discussed need for long-term access with the patient. Will make further recommendations following vein map    Steven Gutt F. Danila Eddie, MD FACS [facility] [place] Office Tel [phone]  [phone] Pager [phone]   

## 2016-05-25 ENCOUNTER — Encounter (HOSPITAL_COMMUNITY): Payer: Self-pay

## 2016-05-25 LAB — GLUCOSE, RANDOM: Glucose, Bld: 498 mg/dL — ABNORMAL HIGH (ref 65–99)

## 2016-05-26 ENCOUNTER — Encounter (HOSPITAL_BASED_OUTPATIENT_CLINIC_OR_DEPARTMENT_OTHER): Payer: Medicaid Other

## 2016-05-26 DIAGNOSIS — N186 End stage renal disease: Secondary | ICD-10-CM

## 2016-05-26 DIAGNOSIS — Z01810 Encounter for preprocedural cardiovascular examination: Secondary | ICD-10-CM

## 2016-05-26 LAB — CBC
HEMATOCRIT: 26.7 % — AB (ref 39.0–52.0)
HEMOGLOBIN: 8.3 g/dL — AB (ref 13.0–17.0)
MCH: 28.1 pg (ref 26.0–34.0)
MCHC: 31.1 g/dL (ref 30.0–36.0)
MCV: 90.5 fL (ref 78.0–100.0)
Platelets: 309 10*3/uL (ref 150–400)
RBC: 2.95 MIL/uL — ABNORMAL LOW (ref 4.22–5.81)
RDW: 18.7 % — ABNORMAL HIGH (ref 11.5–15.5)
WBC: 14.1 10*3/uL — AB (ref 4.0–10.5)

## 2016-05-26 LAB — RENAL FUNCTION PANEL
ALBUMIN: 1.5 g/dL — AB (ref 3.5–5.0)
Anion gap: 11 (ref 5–15)
BUN: 63 mg/dL — AB (ref 6–20)
CHLORIDE: 97 mmol/L — AB (ref 101–111)
CO2: 26 mmol/L (ref 22–32)
Calcium: 9.2 mg/dL (ref 8.9–10.3)
Creatinine, Ser: 5.53 mg/dL — ABNORMAL HIGH (ref 0.61–1.24)
GFR calc Af Amer: 13 mL/min — ABNORMAL LOW (ref >60)
GFR, EST NON AFRICAN AMERICAN: 12 mL/min — AB (ref >60)
Glucose, Bld: 81 mg/dL (ref 65–99)
PHOSPHORUS: 6.5 mg/dL — AB (ref 2.5–4.6)
POTASSIUM: 5.4 mmol/L — AB (ref 3.5–5.1)
Sodium: 134 mmol/L — ABNORMAL LOW (ref 135–145)

## 2016-05-26 NOTE — Progress Notes (Signed)
Right  Upper Extremity Vein Map  Cephalic Unable to visualize  Basilic  Segment Diameter Depth Comment  1. Axilla   Unable to visualize  2. Mid upper arm 4.62mm 22.54mm   3. Above [facility] 4.26mm 7.2mm   4. In [facility] 4.14mm 3.7mm   5. Below AC   Unable to visualize  6. Mid forearm   Unable to visualize  7. Wrist   Unable to visualize                   Left  Upper Extremity Vein Map  Cephalic  Segment Diameter Depth Comment  1. Axilla 1.62mm 2.31mm   2. Mid upper arm   Unable to visualize  3. Above [facility]   Unable to visualize  4. In [facility]   Unable to visualize  5. Below AC   Unable to visualize  6. Mid forearm   Unable to visualize  7. Wrist   Unable to visualize                  Basilic  Segment Diameter Depth Comment  1. Axilla   Unable to visualize  2. Mid upper arm 1.70mm 15.32mm   3. Above [facility]   Unable to visualize  4. In [facility]   Unable to visualizeUnable to visualize  5. Below AC   Unable to visualize  6. Mid forearm   Unable to visualize  7. Wrist   Unable to visualize                   05/26/2016 9:03 AM Gertie Fey, BS, RVT, RDCS, RDMS

## 2016-05-26 NOTE — Progress Notes (Signed)
  Subjective:   Doing fair denies SOB Getting vein mapping this morning   Objective:  Vital signs in last 24 hours:  Temperature 96, pulse 66, respirations 18, blood pressure 131/83    Physical Exam: General: NAD  HEENT Periorbital edema noted- improving  Neck supple  Pulm/lungs Coarse breath sounds, normal effort  CVS/Heart Regular, no rub  Abdomen:  Soft, mild distension   Extremities: Rt leg bandaged, trace edema LLE  Neurologic: Alert,   will follow simple commands  Skin: Necrotic finger tip right 3rd finger  Access: Rt IJ PC       Basic Metabolic Panel:   Recent Labs Lab 05/22/16 0758 05/24/16 0843 05/25/16 0901  NA 133* 134*  --   K 6.1* 5.6*  --   CL 99* 97*  --   CO2 23 22  --   GLUCOSE 173* 313* 498*  BUN 62* 78*  --   CREATININE 6.44* 7.29*  --   CALCIUM 8.8* 9.1  --   PHOS 6.1* 6.6*  --      CBC:  Recent Labs Lab 05/22/16 0758 05/24/16 0843  WBC 13.2* 11.0*  HGB 8.2* 7.6*  HCT 25.7* 24.5*  MCV 91.1 91.8  PLT 281 292      Microbiology:  No results found for this or any previous visit (from the past 720 hour(s)).  Coagulation Studies: No results for input(s): LABPROT, INR in the last 72 hours.  Urinalysis: No results for input(s): COLORURINE, LABSPEC, PHURINE, GLUCOSEU, HGBUR, BILIRUBINUR, KETONESUR, PROTEINUR, UROBILINOGEN, NITRITE, LEUKOCYTESUR in the last 72 hours.  Invalid input(s): APPERANCEUR    Imaging: No results found.   Medications:    Amlodipine 10 mg daily Aranesp 60 mcg weekly Carvedilol 37.5 twice a day Heparin subcutaneous Hydralazine 50 every 8 hours Labetalol 200 twice a day Lantus  bedtime Losartan 50 mg daily sevelamer 2400 mg   Assessment/ Plan:  41 y.o.White  male with ESRD, on dialysis for 4 years, DM-2, HTN, severe PVD, was admitted on 05/16/2016. Presented with rt tibia fib fracture. Conservative management. Hospital course complicated by intra-abdominal abscess; PD catheter has been  removed  1. ESRD- now HD; MWF 2. AOCKD,  3. SHPTH,  on renvela 2400 TID 4. DM-2 with CKD 5. HTN 6. Hyperkalemia  Plan: Continue dialysis Monday, Wednesday, Friday Hemoglobin currently 7.6 continued on Aranesp  Phos Remains high . Continue sevelamer Multiple antihypertensives as noted Vascular evaluation for AV fistula placement Blood sugars remain poorly controlled Heparin 2000 units with dialysis to prevent dialyzer clotting Overall patient has a guarded prognosis.   LOS: 0 Steven Hines 4/27/20189:42 AM

## 2016-05-26 NOTE — Progress Notes (Signed)
    Right  Upper Extremity Vein Map  Cephalic Unable to visualize  Basilic  Segment Diameter Depth Comment  1. Axilla   Unable to visualize  2. Mid upper arm 4.66mm 22.60mm   3. Above [facility] 4.41mm 7.36mm   4. In [facility] 4.72mm 3.32mm   5. Below AC   Unable to visualize  6. Mid forearm   Unable to visualize  7. Wrist   Unable to visualize                  The left arm does not have acceptable vein for access. Surgical plan per Dr. Fayne Mediate, [facility] PA-C

## 2016-05-29 ENCOUNTER — Encounter (HOSPITAL_COMMUNITY): Payer: Medicaid Other | Admitting: Certified Registered"

## 2016-05-29 ENCOUNTER — Encounter: Payer: Self-pay | Admitting: Certified Registered"

## 2016-05-29 ENCOUNTER — Encounter: Admission: AD | Disposition: E | Payer: Self-pay | Source: Ambulatory Visit | Attending: Internal Medicine

## 2016-05-29 DIAGNOSIS — N186 End stage renal disease: Secondary | ICD-10-CM

## 2016-05-29 DIAGNOSIS — Z992 Dependence on renal dialysis: Secondary | ICD-10-CM

## 2016-05-29 HISTORY — PX: BASCILIC VEIN TRANSPOSITION: SHX5742

## 2016-05-29 LAB — RENAL FUNCTION PANEL
ALBUMIN: 1.5 g/dL — AB (ref 3.5–5.0)
Anion gap: 11 (ref 5–15)
BUN: 89 mg/dL — AB (ref 6–20)
CALCIUM: 8.9 mg/dL (ref 8.9–10.3)
CO2: 23 mmol/L (ref 22–32)
CREATININE: 6.61 mg/dL — AB (ref 0.61–1.24)
Chloride: 99 mmol/L — ABNORMAL LOW (ref 101–111)
GFR calc Af Amer: 11 mL/min — ABNORMAL LOW (ref >60)
GFR calc non Af Amer: 9 mL/min — ABNORMAL LOW (ref >60)
GLUCOSE: 400 mg/dL — AB (ref 65–99)
PHOSPHORUS: 6.3 mg/dL — AB (ref 2.5–4.6)
Potassium: 6.5 mmol/L (ref 3.5–5.1)
SODIUM: 133 mmol/L — AB (ref 135–145)

## 2016-05-29 LAB — CBC
HCT: 23.2 % — ABNORMAL LOW (ref 39.0–52.0)
Hemoglobin: 7.2 g/dL — ABNORMAL LOW (ref 13.0–17.0)
MCH: 28 pg (ref 26.0–34.0)
MCHC: 31 g/dL (ref 30.0–36.0)
MCV: 90.3 fL (ref 78.0–100.0)
Platelets: 242 10*3/uL (ref 150–400)
RBC: 2.57 MIL/uL — ABNORMAL LOW (ref 4.22–5.81)
RDW: 19.5 % — ABNORMAL HIGH (ref 11.5–15.5)
WBC: 11.4 10*3/uL — ABNORMAL HIGH (ref 4.0–10.5)

## 2016-05-29 LAB — POCT I-STAT 4, (NA,K, GLUC, HGB,HCT)
Glucose, Bld: 256 mg/dL — ABNORMAL HIGH (ref 65–99)
HEMATOCRIT: 25 % — AB (ref 39.0–52.0)
Hemoglobin: 8.5 g/dL — ABNORMAL LOW (ref 13.0–17.0)
Potassium: 4.3 mmol/L (ref 3.5–5.1)
SODIUM: 134 mmol/L — AB (ref 135–145)

## 2016-05-29 LAB — GLUCOSE, CAPILLARY: Glucose-Capillary: 273 mg/dL — ABNORMAL HIGH (ref 65–99)

## 2016-05-29 SURGERY — TRANSPOSITION, VEIN, BASILIC
Anesthesia: General | Site: Arm Upper | Laterality: Right

## 2016-05-29 MED ORDER — PROPOFOL 10 MG/ML IV BOLUS
INTRAVENOUS | Status: DC | PRN
  Administered 2016-05-29: 90 mg via INTRAVENOUS

## 2016-05-29 MED ORDER — SODIUM CHLORIDE 0.9 % IV SOLN
INTRAVENOUS | Status: DC
  Administered 2016-05-29: 13:00:00 via INTRAVENOUS

## 2016-05-29 MED ORDER — FENTANYL CITRATE (PF) 250 MCG/5ML IJ SOLN
INTRAMUSCULAR | Status: DC | PRN
  Administered 2016-05-29: 50 ug via INTRAVENOUS

## 2016-05-29 MED ORDER — SUCCINYLCHOLINE CHLORIDE 20 MG/ML IJ SOLN
INTRAMUSCULAR | Status: DC | PRN
  Administered 2016-05-29: 100 mg via INTRAVENOUS

## 2016-05-29 MED ORDER — FENTANYL CITRATE (PF) 250 MCG/5ML IJ SOLN
INTRAMUSCULAR | Status: AC
  Filled 2016-05-29: qty 5

## 2016-05-29 MED ORDER — EPHEDRINE SULFATE 50 MG/ML IJ SOLN
INTRAMUSCULAR | Status: DC | PRN
  Administered 2016-05-29: 10 mg via INTRAVENOUS

## 2016-05-29 MED ORDER — MIDAZOLAM HCL 2 MG/2ML IJ SOLN
INTRAMUSCULAR | Status: AC
  Filled 2016-05-29: qty 2

## 2016-05-29 MED ORDER — PHENYLEPHRINE 40 MCG/ML (10ML) SYRINGE FOR IV PUSH (FOR BLOOD PRESSURE SUPPORT)
PREFILLED_SYRINGE | INTRAVENOUS | Status: DC | PRN
  Administered 2016-05-29: 40 ug via INTRAVENOUS
  Administered 2016-05-29: 80 ug via INTRAVENOUS
  Administered 2016-05-29: 40 ug via INTRAVENOUS

## 2016-05-29 MED ORDER — DEXTROSE 5 % IV SOLN
INTRAVENOUS | Status: AC
  Filled 2016-05-29: qty 1.5

## 2016-05-29 MED ORDER — 0.9 % SODIUM CHLORIDE (POUR BTL) OPTIME
TOPICAL | Status: DC | PRN
  Administered 2016-05-29: 1000 mL

## 2016-05-29 MED ORDER — ONDANSETRON HCL 4 MG/2ML IJ SOLN
INTRAMUSCULAR | Status: DC | PRN
  Administered 2016-05-29: 4 mg via INTRAVENOUS

## 2016-05-29 MED ORDER — DEXTROSE 5 % IV SOLN
INTRAVENOUS | Status: DC | PRN
  Administered 2016-05-29: 1.5 g via INTRAVENOUS

## 2016-05-29 MED ORDER — LIDOCAINE 2% (20 MG/ML) 5 ML SYRINGE
INTRAMUSCULAR | Status: DC | PRN
  Administered 2016-05-29: 50 mg via INTRAVENOUS

## 2016-05-29 MED ORDER — PROPOFOL 10 MG/ML IV BOLUS
INTRAVENOUS | Status: AC
  Filled 2016-05-29: qty 20

## 2016-05-29 MED ORDER — SODIUM CHLORIDE 0.9 % IV SOLN
INTRAVENOUS | Status: DC | PRN
  Administered 2016-05-29: 12:00:00

## 2016-05-29 SURGICAL SUPPLY — 31 items
ARMBAND PINK RESTRICT EXTREMIT (MISCELLANEOUS) ×8 IMPLANT
CANISTER SUCT 3000ML PPV (MISCELLANEOUS) ×4 IMPLANT
CLIP TI MEDIUM 6 (CLIP) ×4 IMPLANT
CLIP TI WIDE RED SMALL 6 (CLIP) ×4 IMPLANT
COVER PROBE W GEL 5X96 (DRAPES) ×4 IMPLANT
DECANTER SPIKE VIAL GLASS SM (MISCELLANEOUS) ×4 IMPLANT
DERMABOND ADVANCED (GAUZE/BANDAGES/DRESSINGS) ×2
DERMABOND ADVANCED .7 DNX12 (GAUZE/BANDAGES/DRESSINGS) ×2 IMPLANT
ELECT REM PT RETURN 9FT ADLT (ELECTROSURGICAL) ×4
ELECTRODE REM PT RTRN 9FT ADLT (ELECTROSURGICAL) ×2 IMPLANT
GLOVE BIO SURGEON STRL SZ7 (GLOVE) ×4 IMPLANT
GLOVE BIOGEL PI IND STRL 6.5 (GLOVE) ×2 IMPLANT
GLOVE BIOGEL PI IND STRL 7.5 (GLOVE) ×2 IMPLANT
GLOVE BIOGEL PI INDICATOR 6.5 (GLOVE) ×2
GLOVE BIOGEL PI INDICATOR 7.5 (GLOVE) ×2
GOWN STRL REUS W/ TWL LRG LVL3 (GOWN DISPOSABLE) ×14 IMPLANT
GOWN STRL REUS W/TWL LRG LVL3 (GOWN DISPOSABLE) ×14
HEMOSTAT SPONGE AVITENE ULTRA (HEMOSTASIS) IMPLANT
KIT BASIN OR (CUSTOM PROCEDURE TRAY) ×4 IMPLANT
KIT ROOM TURNOVER OR (KITS) ×4 IMPLANT
MARKER SKIN DUAL TIP RULER LAB (MISCELLANEOUS) ×4 IMPLANT
NS IRRIG 1000ML POUR BTL (IV SOLUTION) ×4 IMPLANT
PACK CV ACCESS (CUSTOM PROCEDURE TRAY) ×4 IMPLANT
PAD ARMBOARD 7.5X6 YLW CONV (MISCELLANEOUS) ×8 IMPLANT
SUT MNCRL AB 4-0 PS2 18 (SUTURE) ×12 IMPLANT
SUT PROLENE 6 0 BV (SUTURE) ×8 IMPLANT
SUT PROLENE 7 0 BV 1 (SUTURE) ×4 IMPLANT
SUT VIC AB 3-0 SH 27 (SUTURE) ×6
SUT VIC AB 3-0 SH 27X BRD (SUTURE) ×6 IMPLANT
UNDERPAD 30X30 (UNDERPADS AND DIAPERS) ×4 IMPLANT
WATER STERILE IRR 1000ML POUR (IV SOLUTION) ×4 IMPLANT

## 2016-05-29 NOTE — Anesthesia Procedure Notes (Signed)
Procedure Name: Intubation Date/Time: 05/03/2016 1:54 PM Performed by: Julieta Bellini Pre-anesthesia Checklist: Patient identified, Emergency Drugs available, Suction available and Patient being monitored Patient Re-evaluated:Patient Re-evaluated prior to inductionOxygen Delivery Method: Circle system utilized Preoxygenation: Pre-oxygenation with 100% oxygen Intubation Type: IV induction Ventilation: Mask ventilation without difficulty Laryngoscope Size: Mac and 3 Grade View: Grade I Tube type: Oral Tube size: 7.5 mm Number of attempts: 1 Airway Equipment and Method: Stylet Placement Confirmation: ETT inserted through vocal cords under direct vision,  positive ETCO2 and breath sounds checked- equal and bilateral Secured at: 22 cm Tube secured with: Tape Dental Injury: Teeth and Oropharynx as per pre-operative assessment

## 2016-05-29 NOTE — H&P (View-Only) (Signed)
Patient ID: Steven Hines, male   DOB: 06-15-1975, 41 y.o.   MRN: [id number]                                       V[facility] and [facility] Specialist of [facility]  Patient name: Steven Hines MRN: [id number] DOB: 1975/10/24 Sex: male  REASON FOR VISIT: Discuss hemodialysis access  HPI: Steven Hines is a 41 y.o. male seen for discussion of hemodialysis access. He is a poor historian. He reports prostate 5 year history of peritoneal dialysis. Unclear as to the etiology of his dialysis requirement. He had an open tib-fib fracture and had surgical treatment at [facility]. Apparently had palpitations related to his perineal dialysis was converted to hemodialysis. He currently is being dialyzed via a right tunneled IJ catheter  No past medical history on file.  No family history on file.  SOCIAL HISTORY: Social History  Substance Use Topics  . Smoking status: Not on file  . Smokeless tobacco: Not on file  . Alcohol use Not on file    Allergies not on file  No current facility-administered medications for this encounter.        PHYSICAL EXAM: There were no vitals filed for this visit.  GENERAL: The patient is a well-nourished male, in no acute distress. The vital signs are documented above. CARDIOVASCULAR: Palpable radial pulses bilaterally. IV in his left wrist cephalic vein. Good caliber right cephalic vein in his forearm. PULMONARY: There is good air exchange  MUSCULOSKELETAL: There are no major deformities or cyanosis. NEUROLOGIC: No focal weakness or paresthesias are detected. SKIN: Skin necrosis on the tip of his right third finger. He denies pain with this PSYCHIATRIC: The patient has a normal affect.  DATA:  Vein mapping pending of both upper extremities  MEDICAL ISSUES: Discussed need for long-term access with the patient. Will make further recommendations following vein map    Larina Earthly, MD [facility] Specialists of [facility] Tel [phone] Pager [phone]

## 2016-05-29 NOTE — Op Note (Signed)
OPERATIVE NOTE   PROCEDURE: 1. right first stage basilic vein transposition (brachiobasilic arteriovenous fistula) placement  PRE-OPERATIVE DIAGNOSIS: end stage renal disease   POST-OPERATIVE DIAGNOSIS: same as above   SURGEON: Leonides Sake, MD  ASSISTANT(S): Karsten Ro, PAC   ANESTHESIA: general  ESTIMATED BLOOD LOSS: 50 cc  FINDING(S): 1. Pre-existing distal phalange gangrene 2. Calcified brachial artery 3. Doppler signal consistent with continuous flow: pulsatile character superimposed 4. Dopplerable radial signal: no significant augmentation with compression out out flow  SPECIMEN(S):  none  INDICATIONS:   Steven Hines is a 41 y.o. male who presents with end stage renal disease.  The patient is scheduled for right arteriovenous fistula vs arteriovenous graft.  The patient is aware the risks include but are not limited to: bleeding, infection, steal syndrome, nerve damage, ischemic monomelic neuropathy, failure to mature, and need for additional procedures.  The patient is aware of the risks of the procedure and elects to proceed forward.   DESCRIPTION: After full informed written consent was obtained from the patient, the patient was brought back to the operating room and placed supine upon the operating table.  Prior to induction, the patient received IV antibiotics.   After obtaining adequate anesthesia, the patient was then prepped and draped in the standard fashion for a right arm access procedure.  I turned my attention first to identifying the patient's basilic vein and brachial artery.  Using SonoSite guidance, the location of these vessels were marked out on the skin.    I made a longitudinal incision at the level of the antecubitum and dissected through the subcutaneous tissue and fascia to gain exposure of the brachial artery.  This was noted to be 4 mm in diameter externally.  This artery was obvious dissected externally with calcification evident.  This was  dissected out proximally and distally and controlled with vessel loops .    I then made a longitudinal incision over the antecubital extent of the basilic, extending slightly onto the forearm to get length.  I used electrocautery and blunt dissection to dissect out the basilic vein.  This was noted to be 4-5 mm in diameter externally.  In this process, the side branches were ligated with 3-0 silk ties.  I marked the anterior orientation of the basilic vein.  The distal segment of the vein was ligated with a  2-0 silk, and the vein was transected.  The proximal segment was interrogated with serial dilators.  The vein accepted up to a 5 mm dilator without any difficulty.  I then instilled the heparinized saline into the vein and clamped it.  I dissected a subcutaneous tunnel and passed the vein through the tunnel.    At this point, I reset my exposure of the brachial artery and placed the artery under tension proximally and distally.  I made an arteriotomy with a #11 blade.  The bleeding was unchanged, so I clamped the artery proximally and distally.  I extended the arteriotomy with a Potts scissor.  I injected heparinized saline in the lumen.   The vein was then sewn to the artery in an end-to-side configuration with a running stitch of 6-0 Prolene.  Prior to completing this anastomosis, I allowed the vein and artery to backbleed.  There was no evidence of clot from any vessels.  I completed the anastomosis in the usual fashion and then released all clamps.  There was no palpable thrill in the venous outflow, rather there was a palpable pulse.  On Sonosite,  there was both a continuous waveform consistent with a patent arteriovenous fistula superimprosed with pulsatile waveform.  There was a dopplerable radial signal.  At this point, I repaired a bleeding point with a 6-0 Prolene.  At this point, I irrigated out the surgical wound.  There was no further active bleeding.    The subcutaneous tissue in both  incisions was reapproximated with a running stitch of 3-0 Vicryl.  The skin in each incision was then reapproximated with a running subcuticular stitch of 4-0 Monocryl.  The skin at both incisions was then cleaned, dried, and reinforced with Dermabond.  The patient tolerated this procedure well.    COMPLICATIONS: none  CONDITION: stable   Leonides Sake, MD, [facility] of [place] Office: [phone] Pager: [phone]  05/22/2016, 2:44 PM

## 2016-05-29 NOTE — Anesthesia Postprocedure Evaluation (Signed)
Anesthesia Post Note  Patient: Steven Hines  Procedure(s) Performed: Procedure(s) (LRB): First Stage BASCILIC VEIN TRANSPOSITION (Right)  Patient location during evaluation: PACU Anesthesia Type: General Level of consciousness: awake, awake and alert and oriented Pain management: pain level controlled Vital Signs Assessment: post-procedure vital signs reviewed and stable Respiratory status: spontaneous breathing, nonlabored ventilation and respiratory function stable Cardiovascular status: blood pressure returned to baseline Anesthetic complications: no       Last Vitals:  Vitals:   05/02/2016 1518 05/27/2016 1530  BP:  (!) 148/94  Pulse: 70 71  Resp: (!) 27 (!) 25  Temp:  36.6 C    Last Pain: There were no vitals filed for this visit.               Mckena Chern COKER

## 2016-05-29 NOTE — Transfer of Care (Signed)
Immediate Anesthesia Transfer of Care Note  Patient: Steven Hines  Procedure(s) Performed: Procedure(s): First Stage BASCILIC VEIN TRANSPOSITION (Right)  Patient Location: PACU  Anesthesia Type:General  Level of Consciousness: drowsy and patient cooperative  Airway & Oxygen Therapy: Patient Spontanous Breathing and Patient connected to nasal cannula oxygen  Post-op Assessment: Report given to RN, Post -op Vital signs reviewed and stable and Patient moving all extremities X 4  Post vital signs: Reviewed and stable  Last Vitals:  Vitals:   05/16/2016 1514  BP: 134/87  Pulse: 69  Resp: 20  Temp: 36.6 C    Last Pain: There were no vitals filed for this visit.       Complications: No apparent anesthesia complications

## 2016-05-29 NOTE — Anesthesia Preprocedure Evaluation (Addendum)
Anesthesia Evaluation  Patient identified by MRN, date of birth, ID band Patient awake    Reviewed: Allergy & Precautions, NPO status , Patient's Chart, lab work & pertinent test results  Airway Mallampati: II  TM Distance: >3 FB Neck ROM: Full    Dental  (+) Edentulous Upper, Dental Advisory Given   Pulmonary    breath sounds clear to auscultation       Cardiovascular  Rhythm:Regular Rate:Normal     Neuro/Psych    GI/Hepatic   Endo/Other    Renal/GU      Musculoskeletal   Abdominal   Peds  Hematology   Anesthesia Other Findings   Reproductive/Obstetrics                             Anesthesia Physical Anesthesia Plan  ASA: III  Anesthesia Plan: General   Post-op Pain Management:    Induction: Intravenous  Airway Management Planned: LMA  Additional Equipment:   Intra-op Plan:   Post-operative Plan:   Informed Consent: I have reviewed the patients History and Physical, chart, labs and discussed the procedure including the risks, benefits and alternatives for the proposed anesthesia with the patient or authorized representative who has indicated his/her understanding and acceptance.   Dental advisory given  Plan Discussed with: CRNA and Anesthesiologist  Anesthesia Plan Comments:         Anesthesia Quick Evaluation

## 2016-05-29 NOTE — Interval H&P Note (Signed)
   History and Physical Update  The patient was interviewed and re-examined.  The patient's previous History and Physical has been reviewed and is unchanged from Dr. Bosie Helper consult.  There is no change in the plan of care: right arm arteriovenous fistula vs arteriovenous graft.   Risk, benefits, and alternatives to access surgery were discussed.    The patient is aware the risks include but are not limited to: bleeding, infection, steal syndrome, nerve damage, ischemic monomelic neuropathy, thrombosis, failure to mature, need for additional procedures, death and stroke.    The patient is aware only fistula options will be a STAGED basilic vein transposition vs brachial vein transposition.  The patient agrees to proceed forward with the procedure. Leonides Sake, MD, FACS [facility] of [place] Office: [phone] Pager: [phone]  05/11/2016, 12:52 PM

## 2016-05-30 ENCOUNTER — Telehealth: Payer: Self-pay | Admitting: Vascular Surgery

## 2016-05-30 ENCOUNTER — Encounter (HOSPITAL_COMMUNITY): Payer: Self-pay | Admitting: Vascular Surgery

## 2016-05-30 NOTE — Telephone Encounter (Signed)
Sched appt 07/25/16 at 1:45. Lm on hm# to inform pt and for pt to confirm appt.

## 2016-05-30 NOTE — Progress Notes (Signed)
  Postoperative hemodialysis access     Date of Surgery:  27-Jun-2016 Surgeon: Imogene Burn  Subjective:  Confused; denies pain in his hand  PHYSICAL EXAMINATION:  Vitals:   27-Jun-2016 1518 2016/06/27 1530  BP:  (!) 148/94  Pulse: 70 71  Resp: (!) 27 (!) 25  Temp:  97.8 F (36.6 C)    Both Incisions are clean and dry without hematoma Sensation in digits is intact;  There is a faint thrill  There is bruit. The graft/fistula is faintly palpable  Tip of right 2nd finger with chronic skin necrosis of tip of right 3rd finger; bilateral radial/ulnar pulses are not palpable.  Motor and sensation in tact right hand   ASSESSMENT/PLAN:  Steven Hines is a 41 y.o. year old male who is s/p right 1st stage basilic vein transposition..  -graft/fistula is patent -pt does not have evidence of steal sx -f/u with Dr. Imogene Burn in 4-6 weeks (07/25/16 @ 1345) to check maturation of AVF & evaluate for 2nd stage transposition -will sign off-call as needed.   Doreatha Massed, PA-C [facility] [phone]

## 2016-05-30 NOTE — Telephone Encounter (Signed)
-----   Message from Sharee Pimple, RN sent at 05/12/2016  3:30 PM EDT ----- Regarding: 6-8 weeks no duplex   ----- Message ----- From: Raymond Gurney, PA-C Sent: 05/19/2016   2:53 PM To: Vvs Charge Pool  s/p right 1st stage bvt 05/04/2016  f/u with Dr. Imogene Burn in 6-8 weeks. No duplex  Thanks,  Selena Batten

## 2016-05-30 DEATH — deceased

## 2016-05-31 ENCOUNTER — Other Ambulatory Visit (HOSPITAL_COMMUNITY): Payer: Medicaid Other

## 2016-05-31 LAB — CBC
HCT: 21.6 % — ABNORMAL LOW (ref 39.0–52.0)
HEMOGLOBIN: 6.7 g/dL — AB (ref 13.0–17.0)
MCH: 28.4 pg (ref 26.0–34.0)
MCHC: 31 g/dL (ref 30.0–36.0)
MCV: 91.5 fL (ref 78.0–100.0)
PLATELETS: 261 10*3/uL (ref 150–400)
RBC: 2.36 MIL/uL — AB (ref 4.22–5.81)
RDW: 19.2 % — ABNORMAL HIGH (ref 11.5–15.5)
WBC: 11 10*3/uL — ABNORMAL HIGH (ref 4.0–10.5)

## 2016-05-31 LAB — RENAL FUNCTION PANEL
ANION GAP: 11 (ref 5–15)
Albumin: 1.3 g/dL — ABNORMAL LOW (ref 3.5–5.0)
BUN: 57 mg/dL — ABNORMAL HIGH (ref 6–20)
CHLORIDE: 97 mmol/L — AB (ref 101–111)
CO2: 28 mmol/L (ref 22–32)
Calcium: 9.1 mg/dL (ref 8.9–10.3)
Creatinine, Ser: 4.92 mg/dL — ABNORMAL HIGH (ref 0.61–1.24)
GFR calc non Af Amer: 13 mL/min — ABNORMAL LOW (ref >60)
GFR, EST AFRICAN AMERICAN: 16 mL/min — AB (ref >60)
Glucose, Bld: 58 mg/dL — ABNORMAL LOW (ref 65–99)
Phosphorus: 6.5 mg/dL — ABNORMAL HIGH (ref 2.5–4.6)
Potassium: 5.4 mmol/L — ABNORMAL HIGH (ref 3.5–5.1)
Sodium: 136 mmol/L (ref 135–145)

## 2016-05-31 LAB — ABO/RH: ABO/RH(D): O POS

## 2016-05-31 LAB — PREPARE RBC (CROSSMATCH)

## 2016-05-31 NOTE — Progress Notes (Signed)
  Subjective:  Patient underwent for stage of basilic vein transposition on Monday. He is due for hemodialysis again today.  Objective:  Vital signs in last 24 hours:  Temperature 98.7 pulse 66 respirations 15 blood pressure 127/87    Physical Exam: General: NAD  HEENT Mild periorbital edema  Neck supple  Pulm/lungs Coarse breath sounds, normal effort  CVS/Heart Regular, no rub  Abdomen:  Soft, mild distension   Extremities: Rt leg bandaged, trace edema LLE  Neurologic: Awake, alert, follows simple commands  Skin: Necrotic finger tip right 3rd finger  Access: Rt IJ PC       Basic Metabolic Panel:   Recent Labs Lab 05/24/16 0843 05/25/16 0901 05/26/16 1326 06/05/16 0547 2016/06/05 1246  NA 134*  --  134* 133* 134*  K 5.6*  --  5.4* 6.5* 4.3  CL 97*  --  97* 99*  --   CO2 22  --  26 23  --   GLUCOSE 313* 498* 81 400* 256*  BUN 78*  --  63* 89*  --   CREATININE 7.29*  --  5.53* 6.61*  --   CALCIUM 9.1  --  9.2 8.9  --   PHOS 6.6*  --  6.5* 6.3*  --      CBC:  Recent Labs Lab 05/24/16 0843 05/26/16 1326 Jun 05, 2016 0547 05-Jun-2016 1246  WBC 11.0* 14.1* 11.4*  --   HGB 7.6* 8.3* 7.2* 8.5*  HCT 24.5* 26.7* 23.2* 25.0*  MCV 91.8 90.5 90.3  --   PLT 292 309 242  --       Microbiology:  No results found for this or any previous visit (from the past 720 hour(s)).  Coagulation Studies: No results for input(s): LABPROT, INR in the last 72 hours.  Urinalysis: No results for input(s): COLORURINE, LABSPEC, PHURINE, GLUCOSEU, HGBUR, BILIRUBINUR, KETONESUR, PROTEINUR, UROBILINOGEN, NITRITE, LEUKOCYTESUR in the last 72 hours.  Invalid input(s): APPERANCEUR    Imaging: No results found.   Medications:    Amlodipine 10 mg daily Aranesp 60 mcg weekly Carvedilol 37.5 twice a day Heparin subcutaneous Hydralazine 50 every 8 hours Labetalol 200 twice a day Lantus  bedtime Losartan 50 mg daily sevelamer 2400 mg   Assessment/ Plan:  41 y.o.White  male with  ESRD, on dialysis for 4 years, DM-2, HTN, severe PVD, was admitted on 05/22/2016. Presented with rt tibia fib fracture. Conservative management. Hospital course complicated by intra-abdominal abscess; PD catheter has been removed  1. ESRD- now HD; MWF 2. AOCKD, hgb 8.2 3. SHPTH,  on renvela 2400 TID 4. DM-2 with CKD 5. HTN 6. Hyperkalemia  Plan: Patient will be due for hemodialysis today. Orders will be prepared. He will have dialysis on Friday as well. Hemoglobin has improved to 8.5.Continue Aranesp for this. Repeat serum phosphorus today as well. We will continue to monitor the patient's progress closely.   LOS: 0 Zo Loudon 5/2/20186:34 AM

## 2016-06-01 LAB — TYPE AND SCREEN
ABO/RH(D): O POS
ANTIBODY SCREEN: NEGATIVE
UNIT DIVISION: 0

## 2016-06-01 LAB — BPAM RBC
Blood Product Expiration Date: 201805092359
ISSUE DATE / TIME: 201805021230
UNIT TYPE AND RH: 5100

## 2016-06-01 LAB — VITAMIN B12: Vitamin B-12: 1774 pg/mL — ABNORMAL HIGH (ref 180–914)

## 2016-06-02 LAB — APTT: APTT: 49 s — AB (ref 24–36)

## 2016-06-02 LAB — PROTIME-INR
INR: 1.28
PROTHROMBIN TIME: 16.1 s — AB (ref 11.4–15.2)

## 2016-06-02 LAB — RENAL FUNCTION PANEL
ALBUMIN: 1.3 g/dL — AB (ref 3.5–5.0)
ANION GAP: 12 (ref 5–15)
BUN: 46 mg/dL — ABNORMAL HIGH (ref 6–20)
CALCIUM: 8.6 mg/dL — AB (ref 8.9–10.3)
CO2: 26 mmol/L (ref 22–32)
Chloride: 95 mmol/L — ABNORMAL LOW (ref 101–111)
Creatinine, Ser: 4.37 mg/dL — ABNORMAL HIGH (ref 0.61–1.24)
GFR, EST AFRICAN AMERICAN: 18 mL/min — AB (ref >60)
GFR, EST NON AFRICAN AMERICAN: 15 mL/min — AB (ref >60)
GLUCOSE: 148 mg/dL — AB (ref 65–99)
PHOSPHORUS: 6.1 mg/dL — AB (ref 2.5–4.6)
POTASSIUM: 4.3 mmol/L (ref 3.5–5.1)
SODIUM: 133 mmol/L — AB (ref 135–145)

## 2016-06-02 LAB — CBC
HCT: 22.6 % — ABNORMAL LOW (ref 39.0–52.0)
HEMOGLOBIN: 7.2 g/dL — AB (ref 13.0–17.0)
MCH: 27.8 pg (ref 26.0–34.0)
MCHC: 31.9 g/dL (ref 30.0–36.0)
MCV: 87.3 fL (ref 78.0–100.0)
Platelets: 277 10*3/uL (ref 150–400)
RBC: 2.59 MIL/uL — AB (ref 4.22–5.81)
RDW: 20.7 % — ABNORMAL HIGH (ref 11.5–15.5)
WBC: 10.1 10*3/uL (ref 4.0–10.5)

## 2016-06-02 NOTE — Progress Notes (Signed)
Subjective:   Confused HD done earlier today 2500 cc fluid was removed   Objective:  Vital signs in last 24 hours:  Temperature 98.4 pulse 89 respirations 24 blood pressure 163/105    Physical Exam: General: NAD, thin cachectic  HEENT Mild periorbital edema  Neck supple  Pulm/lungs Coarse breath sounds, normal effort  CVS/Heart Regular, no rub  Abdomen:  Soft, mild distension   Extremities: Rt leg bandaged, trace edema LLE  Neurologic: Awake, alert, follows simple commands  Skin: Necrotic finger tip right 3rd finger. Left thigh necrotic area  Access: Rt IJ PC       Basic Metabolic Panel:   Recent Labs Lab 05/01/2016 0547 05/14/2016 1246 05/31/16 0733 06/02/16 0538  NA 133* 134* 136 133*  K 6.5* 4.3 5.4* 4.3  CL 99*  --  97* 95*  CO2 23  --  28 26  GLUCOSE 400* 256* 58* 148*  BUN 89*  --  57* 46*  CREATININE 6.61*  --  4.92* 4.37*  CALCIUM 8.9  --  9.1 8.6*  PHOS 6.3*  --  6.5* 6.1*     CBC:  Recent Labs Lab 05/27/2016 0547 05/18/2016 1246 05/31/16 0733 06/02/16 0538  WBC 11.4*  --  11.0* 10.1  HGB 7.2* 8.5* 6.7* 7.2*  HCT 23.2* 25.0* 21.6* 22.6*  MCV 90.3  --  91.5 87.3  PLT 242  --  261 277      Microbiology:  No results found for this or any previous visit (from the past 720 hour(s)).  Coagulation Studies:  Recent Labs  06/02/16 0538  LABPROT 16.1*  INR 1.28    Urinalysis: No results for input(s): COLORURINE, LABSPEC, PHURINE, GLUCOSEU, HGBUR, BILIRUBINUR, KETONESUR, PROTEINUR, UROBILINOGEN, NITRITE, LEUKOCYTESUR in the last 72 hours.  Invalid input(s): APPERANCEUR    Imaging: Ct Abdomen Pelvis Wo Contrast  Result Date: 05/31/2016 CLINICAL DATA:  Follow-up abdominopelvic abscess EXAM: CT ABDOMEN AND PELVIS WITHOUT CONTRAST TECHNIQUE: Multidetector CT imaging of the abdomen and pelvis was performed following the standard protocol without IV contrast. COMPARISON:  None FINDINGS: Lower chest: Moderate bilateral pleural effusions, left  greater than right. Consolidation within the lower lobes may reflect atelectasis or pneumonia. Cardiomegaly with extensive coronary artery calcifications. Small distal esophageal hiatal hernia. Hepatobiliary: No focal hepatic abnormality. No biliary dilatation. Probable contracted gallbladder without calcified stones. Pancreas: Poorly visible, due to diffuse hypodensity throughout the abdomen. Spleen: Normal in size without focal abnormality. Adrenals/Urinary Tract: Adrenal glands within normal limits. Atrophic poorly defined kidneys bilaterally. Suspect a 3.3 cm cyst arising from the lower pole of the right kidney. Perinephric fluid is present. Distended bladder. Stomach/Bowel: Stomach slightly enlarged. No dilated small bowel. Large volume of stool in the colon. No colon wall thickening. Probable partial visualization of the appendix which does not appear dilated. Vascular/Lymphatic: Extensive vascular calcifications throughout the subcutaneous soft tissues, as well is the mesenteric vasculature. Extensive aortic atherosclerosis. No aneurysmal dilatation. Limited evaluation for nodes due to fluid and lack of intra- abdominal fat and generalized decreased density throughout the mesentery. Reproductive: No obvious masses.  Extensive penile calcification Other: No free air.  Diffuse ascites. There is a right-sided drainage catheter with the pigtail visualized along the lateral aspect of the liver. Inferior to the catheter entry site is a gas and fluid containing collection measuring approximately 8.1 x 3.4 x 9.2 cm, suspicious for residual abscess. There is an additional left-sided drainage catheter with the pigtail visualized adjacent to the splenic flexure. Inferior to the drainage catheter site entry,  there is a long vertically oriented fluid collection measuring 14 cm craniocaudad by 2.8 cm AP x 4.4 cm transverse ; this fluid collection extends into the upper pelvis. It is possibly contiguous with additional  focal fluid collection in the central pelvis measuring 4.5 x 3.1 cm however further evaluation limited due to streak artifact from hardware in the right hip. Musculoskeletal: Some sclerosis and cystic change in the right femoral head could relate to mild AVN. There is no acute osseous abnormality. Old left lower rib fracture. IMPRESSION: 1. Difficult study due to lack of contrast, lack of intra-abdominal fat, and generalized low-density and fluid throughout the abdomen and pelvis. 2. Bilateral abdominal drainage catheters as described above. Residual right abdominal abscess measuring 9.2 cm in maximum craniocaudad dimension is seen inferior to the catheter entry site. Additional suspected abscess within the left anterior lower abdomen and extending into the pelvis, also seen inferior to the entry site of left drainage catheter. Possible 4.5 cm abscess within the central pelvis. 3. Moderate bilateral pleural effusions with bilateral lower lobe atelectasis or pneumonia 4. Perinephric fluid.  Fluid throughout the abdomen and pelvis. 5. Extensive vascular calcifications throughout the mesentery and subcutaneous soft tissues. 6. Atrophic kidneys. Electronically Signed   By: Jasmine PangKim  Fujinaga M.D.   On: 05/31/2016 22:17     Medications:     Assessment/ Plan:  41 y.o.White  male with ESRD, on dialysis for 4 years, DM-2, HTN, severe PVD, was admitted on 05/11/2016. Presented with rt tibia fib fracture. Conservative management. Hospital course complicated by intra-abdominal abscess; PD catheter has been removed  1. ESRD- now HD; MWF 2. AOCKD, hgb 7.2 3. SHPTH,  on renvela 2400 TID 4. DM-2 with CKD 5. HTN 6. Hyperkalemia  Plan: Patient had hemodialysis today. Continue Aranesp for anemia Continued on 4 phosphorus binding Next hemodialysis treatment on Monday   LOS: 0 Laquana Villari 5/4/20183:47 PM

## 2016-06-05 LAB — RENAL FUNCTION PANEL
Albumin: 1.4 g/dL — ABNORMAL LOW (ref 3.5–5.0)
Anion gap: 15 (ref 5–15)
BUN: 55 mg/dL — AB (ref 6–20)
CALCIUM: 9 mg/dL (ref 8.9–10.3)
CO2: 22 mmol/L (ref 22–32)
CREATININE: 5.03 mg/dL — AB (ref 0.61–1.24)
Chloride: 94 mmol/L — ABNORMAL LOW (ref 101–111)
GFR, EST AFRICAN AMERICAN: 15 mL/min — AB (ref >60)
GFR, EST NON AFRICAN AMERICAN: 13 mL/min — AB (ref >60)
Glucose, Bld: 297 mg/dL — ABNORMAL HIGH (ref 65–99)
PHOSPHORUS: 6.8 mg/dL — AB (ref 2.5–4.6)
Potassium: 5.9 mmol/L — ABNORMAL HIGH (ref 3.5–5.1)
SODIUM: 131 mmol/L — AB (ref 135–145)

## 2016-06-05 LAB — CBC
HCT: 26.4 % — ABNORMAL LOW (ref 39.0–52.0)
Hemoglobin: 8.6 g/dL — ABNORMAL LOW (ref 13.0–17.0)
MCH: 28.3 pg (ref 26.0–34.0)
MCHC: 32.6 g/dL (ref 30.0–36.0)
MCV: 86.8 fL (ref 78.0–100.0)
PLATELETS: 312 10*3/uL (ref 150–400)
RBC: 3.04 MIL/uL — AB (ref 4.22–5.81)
RDW: 20.3 % — AB (ref 11.5–15.5)
WBC: 11.3 10*3/uL — AB (ref 4.0–10.5)

## 2016-06-05 NOTE — Progress Notes (Signed)
  Subjective:  Patient completed hemodialysis today. Case discussed with RN. Ultrafiltration achieved with 2 kg.   Objective:  Vital signs in last 24 hours:  Temperature 98.7 pulse 89 respirations 16 BP 146/92    Physical Exam: General: NAD, thin cachectic  HEENT Mild periorbital edema  Neck Supple and  Pulm/lungs Coarse breath sounds, normal effort  CVS/Heart Regular, no rub  Abdomen:  Soft, mild distension   Extremities: Rt leg bandaged, trace edema LLE  Neurologic: Awake, alert, follows simple commands  Skin: Necrotic finger tip right 3rd finger. Left thigh necrotic area  Access: Rt IJ PC       Basic Metabolic Panel:   Recent Labs Lab 05/31/16 0733 06/02/16 0538 06/05/16 0607  NA 136 133* 131*  K 5.4* 4.3 5.9*  CL 97* 95* 94*  CO2 28 26 22   GLUCOSE 58* 148* 297*  BUN 57* 46* 55*  CREATININE 4.92* 4.37* 5.03*  CALCIUM 9.1 8.6* 9.0  PHOS 6.5* 6.1* 6.8*     CBC:  Recent Labs Lab 05/31/16 0733 06/02/16 0538 06/05/16 0607  WBC 11.0* 10.1 11.3*  HGB 6.7* 7.2* 8.6*  HCT 21.6* 22.6* 26.4*  MCV 91.5 87.3 86.8  PLT 261 277 312      Microbiology:  No results found for this or any previous visit (from the past 720 hour(s)).  Coagulation Studies: No results for input(s): LABPROT, INR in the last 72 hours.  Urinalysis: No results for input(s): COLORURINE, LABSPEC, PHURINE, GLUCOSEU, HGBUR, BILIRUBINUR, KETONESUR, PROTEINUR, UROBILINOGEN, NITRITE, LEUKOCYTESUR in the last 72 hours.  Invalid input(s): APPERANCEUR    Imaging: No results found.   Medications:     Assessment/ Plan:  41 y.o.White  male with ESRD, on dialysis for 4 years, DM-2, HTN, severe PVD, was admitted on 01/13/17. Presented with rt tibia fib fracture. Conservative management. Hospital course complicated by intra-abdominal abscess; PD catheter has been removed  1. ESRD- now HD; MWF 2. AOCKD, hgb up 8.6  3. SHPTH,  on renvela 2400 TID, phos 6.8 4. DM-2 with CKD 5. HTN 6.  Hyperkalemia, K 5.9.  Plan: Is completed hemodialysis today. Ultrafiltration she was 2 kg. We will plan for dialysis again on Wednesday. His hemoglobin has improved and is currently 8.6. Therefore we will maintain the patient on Aranesp. Phosphorus was a bit high today but this is to be expected over the weekend. Will followup phosphorus on Wednesday. He was a bit hyperkalemic today with a potassium of 5.9 but has undergone dialysis. We will repeat serum potassiumon Wednesday.   LOS: 0 Steven Hines 5/7/20184:15 PM

## 2016-06-07 LAB — RENAL FUNCTION PANEL
ALBUMIN: 1.3 g/dL — AB (ref 3.5–5.0)
ANION GAP: 11 (ref 5–15)
BUN: 34 mg/dL — ABNORMAL HIGH (ref 6–20)
CHLORIDE: 96 mmol/L — AB (ref 101–111)
CO2: 25 mmol/L (ref 22–32)
Calcium: 8.8 mg/dL — ABNORMAL LOW (ref 8.9–10.3)
Creatinine, Ser: 3.34 mg/dL — ABNORMAL HIGH (ref 0.61–1.24)
GFR calc Af Amer: 25 mL/min — ABNORMAL LOW (ref >60)
GFR, EST NON AFRICAN AMERICAN: 21 mL/min — AB (ref >60)
Glucose, Bld: 219 mg/dL — ABNORMAL HIGH (ref 65–99)
PHOSPHORUS: 4.6 mg/dL (ref 2.5–4.6)
Potassium: 5 mmol/L (ref 3.5–5.1)
Sodium: 132 mmol/L — ABNORMAL LOW (ref 135–145)

## 2016-06-07 LAB — CBC
HEMATOCRIT: 23.9 % — AB (ref 39.0–52.0)
HEMOGLOBIN: 7.2 g/dL — AB (ref 13.0–17.0)
MCH: 26.6 pg (ref 26.0–34.0)
MCHC: 30.1 g/dL (ref 30.0–36.0)
MCV: 88.2 fL (ref 78.0–100.0)
Platelets: 261 10*3/uL (ref 150–400)
RBC: 2.71 MIL/uL — ABNORMAL LOW (ref 4.22–5.81)
RDW: 19.7 % — AB (ref 11.5–15.5)
WBC: 7.2 10*3/uL (ref 4.0–10.5)

## 2016-06-07 NOTE — Progress Notes (Signed)
  Subjective:  Patient has completed dialysis today. UF achieved was 2.3kg.    Objective:  Vital signs in last 24 hours:  Temperature 98.2 pulse 103 respirations 20 blood pressure 154/101    Physical Exam: General: NAD, thin cachectic  HEENT Mild periorbital edema  Neck Supple  Pulm/lungs Coarse breath sounds, normal effort  CVS/Heart Regular, no rub  Abdomen:  Soft, mild distension   Extremities: Rt leg bandaged, trace edema LLE  Neurologic: Awake, alert, follows simple commands  Skin: Necrotic finger tip right 3rd finger. Left thigh necrotic area  Access: Rt IJ PC       Basic Metabolic Panel:   Recent Labs Lab 06/02/16 0538 06/05/16 0607 06/07/16 0753  NA 133* 131* 132*  K 4.3 5.9* 5.0  CL 95* 94* 96*  CO2 26 22 25   GLUCOSE 148* 297* 219*  BUN 46* 55* 34*  CREATININE 4.37* 5.03* 3.34*  CALCIUM 8.6* 9.0 8.8*  PHOS 6.1* 6.8* 4.6     CBC:  Recent Labs Lab 06/02/16 0538 06/05/16 0607 06/07/16 0751  WBC 10.1 11.3* 7.2  HGB 7.2* 8.6* 7.2*  HCT 22.6* 26.4* 23.9*  MCV 87.3 86.8 88.2  PLT 277 312 261      Microbiology:  No results found for this or any previous visit (from the past 720 hour(s)).  Coagulation Studies: No results for input(s): LABPROT, INR in the last 72 hours.  Urinalysis: No results for input(s): COLORURINE, LABSPEC, PHURINE, GLUCOSEU, HGBUR, BILIRUBINUR, KETONESUR, PROTEINUR, UROBILINOGEN, NITRITE, LEUKOCYTESUR in the last 72 hours.  Invalid input(s): APPERANCEUR    Imaging: No results found.   Medications:     Assessment/ Plan:  41 y.o.White  male with ESRD, on dialysis for 4 years, DM-2, HTN, severe PVD, was admitted on 05/12/2016. Presented with rt tibia fib fracture. Conservative management. Hospital course complicated by intra-abdominal abscess; PD catheter has been removed  1. ESRD- now HD; MWF 2. AOCKD, hgb down to 7.2 3. SHPTH,  on renvela 2400 TID, phos 4.6 4. DM-2 with CKD 5. HTN 6. Hyperkalemia, K  5.0  Plan: Patient completed hemodialysis today. Ultrafiltration achieved was 2.3 kg. Hemoglobin has dropped a bit to 7.2 which we will need to continue to monitor.  Phosphorus currently down to 4.6. Hyperkalemia also improved today his potassium is down to 5.0 which we will continue to monitor. We will plan for his next dialysis on Friday. If hemoglobin drops to 7 or below recommend blood transfusion. He continues to be quite malnourished. Albumin only 1.3.   LOS: 0 Kora Groom 5/9/20183:08 PM

## 2016-06-08 NOTE — Progress Notes (Signed)
Patient ID: Steven Hines, male   DOB: 22-Nov-1975, 41 y.o.   MRN: [id number]    Referring Physician(s): Dr. Carron CurieAli Hijazi  Supervising Physician: Oley BalmHassell, Daniel  [facility]  Chief Complaint: Intra-abdominal fluid collections  Subjective: Patient wants to know when he can go home.  Otherwise no complaints.  Allergies: No allergies on file  Medications: Prior to Admission medications   Not on File    Vital Signs: BP (!) 148/94   Pulse 71   Temp 97.8 F (36.6 C)   Resp (!) 25   SpO2 100%   Physical Exam: abd: soft, Nt, ND, all drains in place and intact.  Minimal tan-ish output present.  No output is being documented in the chart  Imaging: No results found.  Labs:  CBC:  Recent Labs  05/31/16 0733 06/02/16 0538 06/05/16 0607 06/07/16 0751  WBC 11.0* 10.1 11.3* 7.2  HGB 6.7* 7.2* 8.6* 7.2*  HCT 21.6* 22.6* 26.4* 23.9*  PLT 261 277 312 261    COAGS:  Recent Labs  05/11/16 0617 06/02/16 0538  INR 1.18 1.28  APTT  --  49*    BMP:  Recent Labs  05/31/16 0733 06/02/16 0538 06/05/16 0607 06/07/16 0753  NA 136 133* 131* 132*  K 5.4* 4.3 5.9* 5.0  CL 97* 95* 94* 96*  CO2 28 26 22 25   GLUCOSE 58* 148* 297* 219*  BUN 57* 46* 55* 34*  CALCIUM 9.1 8.6* 9.0 8.8*  CREATININE 4.92* 4.37* 5.03* 3.34*  GFRNONAA 13* 15* 13* 21*  GFRAA 16* 18* 15* 25*    LIVER FUNCTION TESTS:  Recent Labs  05/11/16 0617  05/31/16 0733 06/02/16 0538 06/05/16 0607 06/07/16 0753  BILITOT 0.3  --   --   --   --   --   AST 26  --   --   --   --   --   ALT 13*  --   --   --   --   --   ALKPHOS 181*  --   --   --   --   --   PROT 6.3*  --   --   --   --   --   ALBUMIN 1.5*  < > 1.3* 1.3* 1.4* 1.3*  < > = values in this interval not displayed.  Assessment and Plan: 1. Multiple intra-abdominal fluid collections, s/p drain placement from outside facility All drains are currently stable.   Cont current treatment. Cont to flush drains q shift and document  output accurately.   Electronically Signed: Letha CapeSBORNE,Arlan Birks E 06/08/2016, 10:39 AM   I spent a total of 15 Minutes at the the patient's bedside AND on the patient's hospital floor or unit, greater than 50% of which was counseling/coordinating care for intra-abdominal fluid collections

## 2016-06-09 ENCOUNTER — Other Ambulatory Visit (HOSPITAL_COMMUNITY): Payer: Medicaid Other

## 2016-06-09 LAB — RENAL FUNCTION PANEL
ANION GAP: 10 (ref 5–15)
Albumin: 1.3 g/dL — ABNORMAL LOW (ref 3.5–5.0)
BUN: 54 mg/dL — ABNORMAL HIGH (ref 6–20)
CHLORIDE: 95 mmol/L — AB (ref 101–111)
CO2: 26 mmol/L (ref 22–32)
Calcium: 9.2 mg/dL (ref 8.9–10.3)
Creatinine, Ser: 3.89 mg/dL — ABNORMAL HIGH (ref 0.61–1.24)
GFR, EST AFRICAN AMERICAN: 21 mL/min — AB (ref >60)
GFR, EST NON AFRICAN AMERICAN: 18 mL/min — AB (ref >60)
Glucose, Bld: 146 mg/dL — ABNORMAL HIGH (ref 65–99)
POTASSIUM: 4.5 mmol/L (ref 3.5–5.1)
Phosphorus: 6.1 mg/dL — ABNORMAL HIGH (ref 2.5–4.6)
Sodium: 131 mmol/L — ABNORMAL LOW (ref 135–145)

## 2016-06-09 LAB — CBC
HEMATOCRIT: 23.6 % — AB (ref 39.0–52.0)
Hemoglobin: 7.5 g/dL — ABNORMAL LOW (ref 13.0–17.0)
MCH: 27.7 pg (ref 26.0–34.0)
MCHC: 31.8 g/dL (ref 30.0–36.0)
MCV: 87.1 fL (ref 78.0–100.0)
Platelets: 275 10*3/uL (ref 150–400)
RBC: 2.71 MIL/uL — AB (ref 4.22–5.81)
RDW: 19 % — ABNORMAL HIGH (ref 11.5–15.5)
WBC: 11.8 10*3/uL — AB (ref 4.0–10.5)

## 2016-06-09 LAB — AMMONIA
AMMONIA: 26 umol/L (ref 9–35)
Ammonia: 25 umol/L (ref 9–35)

## 2016-06-09 MED ORDER — IOPAMIDOL (ISOVUE-300) INJECTION 61%
100.0000 mL | Freq: Once | INTRAVENOUS | Status: AC | PRN
  Administered 2016-06-09: 100 mL via INTRAVENOUS

## 2016-06-09 NOTE — Progress Notes (Signed)
    Referring Physician(s): Dr. Carron CurieAli Hijazi  Supervising Physician: Malachy MoanMcCullough, Heath  [name]  Chief Complaint:  Abd/Pelvic abscesses Drain placed at another facility  Subjective:  [name] is very sleeping this morning and is non-verbal. He is currently on dialysis. He does not follow any commands. He opens his eyes a little to loud voice but falls back to sleep.  Allergies: No allergies on file  Medications: Prior to Admission medications   Not on File     Vital Signs: BP (!) 148/94   Pulse 71   Temp 97.8 F (36.6 C)   Resp (!) 25   SpO2 100%   Physical Exam Somnolent Opens eyes to loud voice. Abdomen flat, soft, drains RUQ and Left lateral abdomen No output seen in either drain and none recorded. Patient remains afebrile  Imaging: No results found.  Labs:  CBC:  Recent Labs  06/02/16 0538 06/05/16 0607 06/07/16 0751 06/09/16 0805  WBC 10.1 11.3* 7.2 11.8*  HGB 7.2* 8.6* 7.2* 7.5*  HCT 22.6* 26.4* 23.9* 23.6*  PLT 277 312 261 275    COAGS:  Recent Labs  05/11/16 0617 06/02/16 0538  INR 1.18 1.28  APTT  --  49*    BMP:  Recent Labs  06/02/16 0538 06/05/16 0607 06/07/16 0753 06/09/16 0805  NA 133* 131* 132* 131*  K 4.3 5.9* 5.0 4.5  CL 95* 94* 96* 95*  CO2 26 22 25 26   GLUCOSE 148* 297* 219* 146*  BUN 46* 55* 34* 54*  CALCIUM 8.6* 9.0 8.8* 9.2  CREATININE 4.37* 5.03* 3.34* 3.89*  GFRNONAA 15* 13* 21* 18*  GFRAA 18* 15* 25* 21*    LIVER FUNCTION TESTS:  Recent Labs  05/11/16 0617  06/02/16 0538 06/05/16 0607 06/07/16 0753 06/09/16 0805  BILITOT 0.3  --   --   --   --   --   AST 26  --   --   --   --   --   ALT 13*  --   --   --   --   --   ALKPHOS 181*  --   --   --   --   --   PROT 6.3*  --   --   --   --   --   ALBUMIN 1.5*  < > 1.3* 1.4* 1.3* 1.3*  < > = values in this interval not displayed.  Assessment and Plan: Multiple intra-abdominal fluid collections, s/p drain placement from  outside facility.  All drains are currently stable.    Discussed with Dr. Archer AsaMcCullough  Will obtain CT scan Abd/Pelvis with Contrast to evaluate abscesses and drain locations given continued little to no output.  For now, cont to flush drains q shift and document output if there is any.   May need repositioning of drains.  Electronically Signed: Gwynneth MacleodWENDY S Chloe Baig PA-C 06/09/2016, 9:24 AM   I spent a total of 15 Minutes at the the patient's bedside AND on the patient's hospital floor or unit, greater than 50% of which was counseling/coordinating care for follow up of drains.

## 2016-06-09 NOTE — Progress Notes (Signed)
  Subjective:  Patient hypoglycemic this a.m. He was given glucagon x1, and D50 x2. He is on D10W at the moment. Patient also due for hemodialysis this a.m.He is awake but confused.   Objective:  Vital signs in last 24 hours:  Temperature 98.2 pulse 103 respirations 20 blood pressure 154/101    Physical Exam: General: NAD, thin cachectic  HEENT Mild periorbital edema  Neck Supple  Pulm/lungs Coarse breath sounds, normal effort  CVS/Heart Regular, no rub  Abdomen:  Soft, mild distension, BS present   Extremities: Rt leg bandaged, trace edema LLE, necrotic area left thigh  Neurologic: Awake, confused, follows simple commands  Skin: Necrotic finger tip right 3rd finger. Left thigh necrotic area  Access: Rt IJ PC       Basic Metabolic Panel:   Recent Labs Lab 06/05/16 0607 06/07/16 0753  NA 131* 132*  K 5.9* 5.0  CL 94* 96*  CO2 22 25  GLUCOSE 297* 219*  BUN 55* 34*  CREATININE 5.03* 3.34*  CALCIUM 9.0 8.8*  PHOS 6.8* 4.6     CBC:  Recent Labs Lab 06/05/16 0607 06/07/16 0751  WBC 11.3* 7.2  HGB 8.6* 7.2*  HCT 26.4* 23.9*  MCV 86.8 88.2  PLT 312 261      Microbiology:  No results found for this or any previous visit (from the past 720 hour(s)).  Coagulation Studies: No results for input(s): LABPROT, INR in the last 72 hours.  Urinalysis: No results for input(s): COLORURINE, LABSPEC, PHURINE, GLUCOSEU, HGBUR, BILIRUBINUR, KETONESUR, PROTEINUR, UROBILINOGEN, NITRITE, LEUKOCYTESUR in the last 72 hours.  Invalid input(s): APPERANCEUR    Imaging: No results found.   Medications:     Assessment/ Plan:  41 y.o.White  male with ESRD, on dialysis for 4 years, DM-2, HTN, severe PVD, was admitted on 05/14/2016. Presented with rt tibia fib fracture. Conservative management. Hospital course complicated by intra-abdominal abscess; PD catheter has been removed  1. ESRD- now HD; MWF 2. AOCKD, hgb down to 7.2 3. SHPTH,  on renvela 2400 TID, phos  4.6 4. DM-2 with CKD 5. HTN 6. Hyperkalemia, K 5.0  Plan: Patient was a bit hypoglycemic this a.m. He has been administered glucagon times one, D50 x2, and is currently on a D10W.Once he is stabilized a bit we will proceed with hemodialysis this a.m. And continue him on a Monday, was it, and Friday schedule.  We are awaiting additional labs the same including followup of CBC as well as serum phosphorus. We also plan to repeat serum potassiumis periodically he's been hyperkalemic. Overall patient continues to have a card prognosis.   LOS: 0 Lumen Brinlee 5/11/20188:17 AM

## 2016-06-10 LAB — HEPATITIS B SURFACE ANTIGEN: Hepatitis B Surface Ag: NEGATIVE

## 2016-06-10 NOTE — Progress Notes (Signed)
Referring Physician(s): Dr Ardeth Sportsman  Supervising Physician: Malachy Moan  Patient Status:  [facility] - In-pt  Chief Complaint:  2 abd/pelvic drain placed in outside facility Followed now with Radiology   Subjective:  CT 5/11:IMPRESSION: 1. While there has been interval improvement in all 3 previously described abdominopelvic abscesses, the 2 dominant collections could likely be further improved by repositioning the existing percutaneous drainage catheters. The drainage catheters could potentially be repositioned fluoroscopically into the more inferior and dependent undrained portions of the fluid collections. 2. Persistent large left and moderate right layering pleural effusions. 3. Significant atherosclerotic vascular disease and medial arterial sclerosis. 4. Probable focal significant stenosis of the right common iliac artery. 5. 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  OP from each drain is minimal Pt afeb Has no pain    Allergies: No allergies on file  Medications: Prior to Admission medications   Not on File     Vital Signs: BP (!) 148/94   Pulse 71   Temp 97.8 F (36.6 C)   Resp (!) 25   SpO2 100%   Physical Exam  Constitutional: He is oriented to person, place, and time.  Abdominal: Soft. Bowel sounds are normal.  Musculoskeletal: Normal range of motion.  Neurological: He is alert and oriented to person, place, and time.  Skin: Skin is warm and dry.  Sites are clean and dry NT No bleeding OP is minimal In bags is clear yellow fluid   Psychiatric: He has a normal mood and affect. His behavior is normal.  Nursing note and vitals reviewed.   Imaging: Ct Abdomen Pelvis W Contrast  Result Date: 06/10/2016 CLINICAL DATA:  41 year old male with a history of abdominal and pelvic abscesses treated by percutaneous drainage at an outside institution. Patient is currently admitted to the [facility] here at [facility]. Evaluate for drain position and residual abscess collections. EXAM: CT ABDOMEN AND PELVIS WITH CONTRAST TECHNIQUE: Multidetector CT imaging of the abdomen and pelvis was performed using the standard protocol following bolus administration of intravenous contrast. CONTRAST:  ISOVUE-300 IOPAMIDOL (ISOVUE-300) INJECTION 61% COMPARISON:  Most recent prior CT scan of the abdomen and pelvis 06/01/2006 teen FINDINGS: Lower chest: Similar size and volume of moderate right and large left layering pleural effusions with associated lower lobe atelectasis. Extensive calcifications along of the visualized coronary arteries again noted. The heart remains normal in size. No pericardial effusion. The non atelectatic portions of the lower lungs are clear. Unremarkable distal thoracic esophagus. Hepatobiliary: Normal hepatic contour and morphology. No discrete hepatic lesions. Normal appearance of the gallbladder. No intra or extrahepatic biliary ductal dilatation. Pancreas: Atrophic.  No pancreatic mass identified. Spleen: Normal in size without focal abnormality. Adrenals/Urinary Tract: The native kidneys are severely atrophic and contain multiple tiny low-attenuation lesions most consistent with either a numerous small cysts, or potentially medullary sponge kidney. There is a solitary 3 cm simple cyst exophytic from the lower pole of the right kidney. The bladder contains urine. Stomach/Bowel: No focal bowel wall thickening or evidence of obstruction. Large colonic stool burden suggests constipation. Vascular/Lymphatic: Severe medial arterial sclerosis. Atherosclerotic calcifications also present in the abdominal aorta and bilateral iliac arteries. There is likely a hemodynamically significant stenosis in the right common iliac artery. No suspicious lymphadenopathy. Reproductive: The prostate gland is unremarkable. Other: There is a percutaneous drainage catheter in the right upper quadrant  adjacent to the liver. No undrained fluid around the pigtail of the  drainage catheter. However, there is a persistent fluid and gas collection in the right lower quadrant beginning at the site of entry of the drainage catheter into the peritoneal space which has slightly improved compared to prior presently measuring 6.0 x 2.4 x 7.3 cm compared 8.1 x 3.4 x 9.2 cm. The second drainage catheter is located in the left upper quadrant just inferior to the spleen. There is no undrained fluid collection about the loop of the pigtail catheter. However, tracking inferiorly from the entry point of the catheter into the peritoneal space there is a thin channel of fluid which connects to a residual abscess just left of midline in the low abdomen. This fluid collection is also slightly improved compared to prior presently measuring approximately 3.5 by 2.4 x 11.1 cm compared to 4.4 x 2.8 x 13.6 cm. A final probable fluid collection deep in the anatomic pelvis is also slightly improved at 3.7 x 2.2 cm compared to 4.5 x 3.1 cm previously. No new abscesses or complex fluid collections identified. Musculoskeletal: No acute osseous abnormality. Surgical changes of prior ORIF of a right acetabular fracture. Changes of avascular necrosis present in the right femoral head. IMPRESSION: 1. While there has been interval improvement in all 3 previously described abdominopelvic abscesses, the 2 dominant collections could likely be further improved by repositioning the existing percutaneous drainage catheters. The drainage catheters could potentially be repositioned fluoroscopically into the more inferior and dependent undrained portions of the fluid collections. 2. Persistent large left and moderate right layering pleural effusions. 3. Significant atherosclerotic vascular disease and medial arterial sclerosis. 4. Probable focal significant stenosis of the right common iliac artery. 5. Changes of avascular necrosis in the right greater than  left femoral heads. Electronically Signed   By: Malachy MoanHeath  McCullough M.D.   On: 06/10/2016 08:50    Labs:  CBC:  Recent Labs  06/02/16 0538 06/05/16 0607 06/07/16 0751 06/09/16 0805  WBC 10.1 11.3* 7.2 11.8*  HGB 7.2* 8.6* 7.2* 7.5*  HCT 22.6* 26.4* 23.9* 23.6*  PLT 277 312 261 275    COAGS:  Recent Labs  05/11/16 0617 06/02/16 0538  INR 1.18 1.28  APTT  --  49*    BMP:  Recent Labs  06/02/16 0538 06/05/16 0607 06/07/16 0753 06/09/16 0805  NA 133* 131* 132* 131*  K 4.3 5.9* 5.0 4.5  CL 95* 94* 96* 95*  CO2 26 22 25 26   GLUCOSE 148* 297* 219* 146*  BUN 46* 55* 34* 54*  CALCIUM 8.6* 9.0 8.8* 9.2  CREATININE 4.37* 5.03* 3.34* 3.89*  GFRNONAA 15* 13* 21* 18*  GFRAA 18* 15* 25* 21*    LIVER FUNCTION TESTS:  Recent Labs  05/11/16 0617  06/02/16 0538 06/05/16 0607 06/07/16 0753 06/09/16 0805  BILITOT 0.3  --   --   --   --   --   AST 26  --   --   --   --   --   ALT 13*  --   --   --   --   --   ALKPHOS 181*  --   --   --   --   --   PROT 6.3*  --   --   --   --   --   ALBUMIN 1.5*  < > 1.3* 1.4* 1.3* 1.3*  < > = values in this interval not displayed.  Assessment and Plan:  abd/pelvic abscess drains intact CT shows much improved collections - but need for  drain catheter manipulation  for best position and drainage of remaining collections Plan for drain manipulations in IR Mon or Tues of coming week Pt aware and agreeable  Electronically Signed: Shandricka Monroy A 06/10/2016, 9:07 AM   I spent a total of 15 Minutes at the the patient's bedside AND on the patient's hospital floor or unit, greater than 50% of which was counseling/coordinating care for abscess drains

## 2016-06-11 LAB — CBC
HEMATOCRIT: 22.3 % — AB (ref 39.0–52.0)
HEMOGLOBIN: 7.3 g/dL — AB (ref 13.0–17.0)
MCH: 28.6 pg (ref 26.0–34.0)
MCHC: 32.7 g/dL (ref 30.0–36.0)
MCV: 87.5 fL (ref 78.0–100.0)
Platelets: 263 10*3/uL (ref 150–400)
RBC: 2.55 MIL/uL — AB (ref 4.22–5.81)
RDW: 19.5 % — ABNORMAL HIGH (ref 11.5–15.5)
WBC: 14.8 10*3/uL — ABNORMAL HIGH (ref 4.0–10.5)

## 2016-06-12 ENCOUNTER — Encounter (HOSPITAL_COMMUNITY): Payer: Self-pay | Admitting: Interventional Radiology

## 2016-06-12 ENCOUNTER — Other Ambulatory Visit (HOSPITAL_COMMUNITY): Payer: Medicaid Other

## 2016-06-12 HISTORY — PX: IR CATHETER TUBE CHANGE: IMG717

## 2016-06-12 LAB — RENAL FUNCTION PANEL
ANION GAP: 14 (ref 5–15)
Albumin: 1.3 g/dL — ABNORMAL LOW (ref 3.5–5.0)
BUN: 51 mg/dL — AB (ref 6–20)
CALCIUM: 8.4 mg/dL — AB (ref 8.9–10.3)
CHLORIDE: 92 mmol/L — AB (ref 101–111)
CO2: 25 mmol/L (ref 22–32)
Creatinine, Ser: 3.82 mg/dL — ABNORMAL HIGH (ref 0.61–1.24)
GFR, EST AFRICAN AMERICAN: 21 mL/min — AB (ref >60)
GFR, EST NON AFRICAN AMERICAN: 18 mL/min — AB (ref >60)
GLUCOSE: 82 mg/dL (ref 65–99)
Phosphorus: 6.8 mg/dL — ABNORMAL HIGH (ref 2.5–4.6)
Potassium: 4.5 mmol/L (ref 3.5–5.1)
Sodium: 131 mmol/L — ABNORMAL LOW (ref 135–145)

## 2016-06-12 LAB — CBC
HEMATOCRIT: 18.5 % — AB (ref 39.0–52.0)
Hemoglobin: 6.2 g/dL — CL (ref 13.0–17.0)
MCH: 29.1 pg (ref 26.0–34.0)
MCHC: 33.5 g/dL (ref 30.0–36.0)
MCV: 86.9 fL (ref 78.0–100.0)
PLATELETS: 199 10*3/uL (ref 150–400)
RBC: 2.13 MIL/uL — AB (ref 4.22–5.81)
RDW: 19.4 % — ABNORMAL HIGH (ref 11.5–15.5)
WBC: 10.9 10*3/uL — AB (ref 4.0–10.5)

## 2016-06-12 LAB — PREPARE RBC (CROSSMATCH)

## 2016-06-12 MED ORDER — LIDOCAINE HCL 1 % IJ SOLN
INTRAMUSCULAR | Status: AC
  Filled 2016-06-12: qty 20

## 2016-06-12 MED ORDER — IOPAMIDOL (ISOVUE-300) INJECTION 61%
INTRAVENOUS | Status: AC
  Filled 2016-06-12: qty 50

## 2016-06-12 NOTE — Progress Notes (Signed)
  Subjective:  Patient is lethargic today Just completed his hemodialysis Did not answer any questions 2.6 L of fluid was removed   Objective:  Vital signs in last 24 hours:  Temperature 98, pulse 72, respirations 32, blood pressure 117/72    Physical Exam: General: NAD, thin cachectic  HEENT Mild periorbital edema  Neck Supple  Pulm/lungs Coarse breath sounds, normal effort  CVS/Heart Regular, no rub  Abdomen:  Soft, mild distension, abdominal drains in place  Extremities: Rt leg bandaged, trace edema LLE, necrotic area left thigh  Neurologic: somnolent today. Did not wake up or answer any questions  Skin: Necrotic finger tip right 3rd finger. Left thigh necrotic area  Access: Rt IJ PC       Basic Metabolic Panel:   Recent Labs Lab 06/07/16 0753 06/09/16 0805 06/12/16 0809  NA 132* 131* 131*  K 5.0 4.5 4.5  CL 96* 95* 92*  CO2 25 26 25   GLUCOSE 219* 146* 82  BUN 34* 54* 51*  CREATININE 3.34* 3.89* 3.82*  CALCIUM 8.8* 9.2 8.4*  PHOS 4.6 6.1* 6.8*     CBC:  Recent Labs Lab 06/07/16 0751 06/09/16 0805 06/11/16 0533 06/12/16 0809  WBC 7.2 11.8* 14.8* 10.9*  HGB 7.2* 7.5* 7.3* 6.2*  HCT 23.9* 23.6* 22.3* 18.5*  MCV 88.2 87.1 87.5 86.9  PLT 261 275 263 199      Microbiology:  No results found for this or any previous visit (from the past 720 hour(s)).  Coagulation Studies: No results for input(s): LABPROT, INR in the last 72 hours.  Urinalysis: No results for input(s): COLORURINE, LABSPEC, PHURINE, GLUCOSEU, HGBUR, BILIRUBINUR, KETONESUR, PROTEINUR, UROBILINOGEN, NITRITE, LEUKOCYTESUR in the last 72 hours.  Invalid input(s): APPERANCEUR    Imaging: No results found.   Medications:     Assessment/ Plan:  41 y.o.White  male with ESRD, on dialysis for 4 years, DM-2, HTN, severe PVD, was admitted on 05/11/2016. Presented with rt tibia fib fracture. Conservative management. Hospital course complicated by intra-abdominal abscess; PD catheter  has been removed  1. ESRD- now HD; MWF 2. AOCKD, 3. SHPTH,  on renvela 2400 TID  4. DM-2 with CKD 5. HTN    Plan: Patient is continued on hemodialysis schedule. 2.6 L of fluid was removed. He is very somnolent today. He gets alprazolam 0.25 twice a day. Consider discontinuing that. Continued on amlodipine 10 mg daily, hydralazine 75 three times a day and 25 twice a day, labetalol 200 mg twice a day, losartan 50 mg daily for blood pressure sevelamer 2400 mg 3 times a day for phosphorus for binding Aranesp 60 mcg q. Wednesday for anemia. Also blood transfusion is planned   LOS: 0 Nikisha Fleece 5/14/20181:03 PM

## 2016-06-12 NOTE — Procedures (Signed)
R abd 14 Fr abscess drain exchange EBL 0 Comp 0

## 2016-06-13 ENCOUNTER — Other Ambulatory Visit (HOSPITAL_COMMUNITY): Payer: Medicaid Other

## 2016-06-13 ENCOUNTER — Encounter (HOSPITAL_COMMUNITY): Payer: Self-pay | Admitting: Interventional Radiology

## 2016-06-13 HISTORY — PX: IR CATHETER TUBE CHANGE: IMG717

## 2016-06-13 HISTORY — PX: IR SINUS/FIST TUBE CHK-NON GI: IMG673

## 2016-06-13 LAB — HEMOGLOBIN AND HEMATOCRIT, BLOOD
HCT: 21.4 % — ABNORMAL LOW (ref 39.0–52.0)
Hemoglobin: 7.1 g/dL — ABNORMAL LOW (ref 13.0–17.0)

## 2016-06-13 MED ORDER — IOPAMIDOL (ISOVUE-300) INJECTION 61%
INTRAVENOUS | Status: AC
  Filled 2016-06-13: qty 50

## 2016-06-13 MED ORDER — LIDOCAINE HCL 1 % IJ SOLN
INTRAMUSCULAR | Status: AC
  Filled 2016-06-13: qty 20

## 2016-06-13 NOTE — Procedures (Signed)
Interventional Radiology Procedure Note  Procedure:  Left abdominal abscess catheter injection and exchange  Complications: None  Estimated Blood Loss: None  Injection of 14 Fr drain in left lateral abdomen demonstrates persistent abscess cavity inferior to drain extending into LLQ. After removing drain, wire redirected into inferior aspect of cavity and new 14 Fr drain placed.  Attached to suction bulb.  Jodi MarbleGlenn T. Fredia SorrowYamagata, M.D Pager:  [phone]

## 2016-06-14 LAB — RENAL FUNCTION PANEL
ANION GAP: 12 (ref 5–15)
Albumin: 1.2 g/dL — ABNORMAL LOW (ref 3.5–5.0)
BUN: 55 mg/dL — ABNORMAL HIGH (ref 6–20)
CHLORIDE: 95 mmol/L — AB (ref 101–111)
CO2: 23 mmol/L (ref 22–32)
Calcium: 9 mg/dL (ref 8.9–10.3)
Creatinine, Ser: 4 mg/dL — ABNORMAL HIGH (ref 0.61–1.24)
GFR, EST AFRICAN AMERICAN: 20 mL/min — AB (ref >60)
GFR, EST NON AFRICAN AMERICAN: 17 mL/min — AB (ref >60)
Glucose, Bld: 67 mg/dL (ref 65–99)
POTASSIUM: 5.3 mmol/L — AB (ref 3.5–5.1)
Phosphorus: 7.6 mg/dL — ABNORMAL HIGH (ref 2.5–4.6)
Sodium: 130 mmol/L — ABNORMAL LOW (ref 135–145)

## 2016-06-14 LAB — CBC
HCT: 19.4 % — ABNORMAL LOW (ref 39.0–52.0)
HEMOGLOBIN: 6.2 g/dL — AB (ref 13.0–17.0)
MCH: 27.8 pg (ref 26.0–34.0)
MCHC: 32 g/dL (ref 30.0–36.0)
MCV: 87 fL (ref 78.0–100.0)
PLATELETS: 220 10*3/uL (ref 150–400)
RBC: 2.23 MIL/uL — AB (ref 4.22–5.81)
RDW: 18.1 % — ABNORMAL HIGH (ref 11.5–15.5)
WBC: 15.1 10*3/uL — ABNORMAL HIGH (ref 4.0–10.5)

## 2016-06-14 LAB — PREPARE RBC (CROSSMATCH)

## 2016-06-14 NOTE — Progress Notes (Signed)
Subjective:  Patient is lethargic today Just completed his hemodialysis 700 cc of fluid was removed Patient's appetite is very poor. He is requiring D. 10 drip at 30 cc an hour  Objective:  Vital signs in last 24 hours:  Temperature 96.8, pulse 63, respirations 26, blood pressure 132/80    Physical Exam: General: NAD, thin cachectic  HEENT Mild periorbital edema  Neck Supple  Pulm/lungs Coarse breath sounds, normal effort  CVS/Heart Regular, no rub  Abdomen:  Soft, mild distension, abdominal drains in place  Extremities: Rt leg bandaged, trace edema LLE, necrotic area left thigh  Neurologic: Alert, able to answer questions  Skin: Necrotic finger tip right 3rd finger. Left thigh necrotic area  Access: Rt IJ PC       Basic Metabolic Panel:   Recent Labs Lab 06/09/16 0805 06/12/16 0809 06/14/16 0710  NA 131* 131* 130*  K 4.5 4.5 5.3*  CL 95* 92* 95*  CO2 26 25 23   GLUCOSE 146* 82 67  BUN 54* 51* 55*  CREATININE 3.89* 3.82* 4.00*  CALCIUM 9.2 8.4* 9.0  PHOS 6.1* 6.8* 7.6*     CBC:  Recent Labs Lab 06/09/16 0805 06/11/16 0533 06/12/16 0809 06/13/16 1623 06/14/16 0710  WBC 11.8* 14.8* 10.9*  --  15.1*  HGB 7.5* 7.3* 6.2* 7.1* 6.2*  HCT 23.6* 22.3* 18.5* 21.4* 19.4*  MCV 87.1 87.5 86.9  --  87.0  PLT 275 263 199  --  220      Microbiology:  No results found for this or any previous visit (from the past 720 hour(s)).  Coagulation Studies: No results for input(s): LABPROT, INR in the last 72 hours.  Urinalysis: No results for input(s): COLORURINE, LABSPEC, PHURINE, GLUCOSEU, HGBUR, BILIRUBINUR, KETONESUR, PROTEINUR, UROBILINOGEN, NITRITE, LEUKOCYTESUR in the last 72 hours.  Invalid input(s): APPERANCEUR    Imaging: Ir Sinus/fist Tube Chk-non Gi  Result Date: 06/13/2016 INDICATION: Status post percutaneous drainage of both right and left intraabdominal abscess fluid collections at outside hospital related to peritonitis from prior peritoneal  dialysis. The right-sided abdominal drain was exchanged yesterday and re- directed inferiorly into a residual abscess cavity. Similarly, recent CT demonstrates persistent fluid cavity inferior to the left-sided abdominal drain and injection with potential drain exchange/re- direction is now performed on the left. EXAM: SINUS TRACT INJECTION / FISTULOGRAM ABSCESS DRAINAGE CATHETER EXCHANGE COMPARISON:  CT of the abdomen and pelvis on 06/09/2016 MEDICATIONS: No medications administered during the procedure. ANESTHESIA/SEDATION: None CONTRAST:  10 mL Isovue-300 FLUOROSCOPY TIME:  2 minutes and 24 seconds.  13 mGy. COMPLICATIONS: None immediate. TECHNIQUE: Informed written consent was obtained from the patient after a thorough discussion of the procedural risks, benefits and alternatives. All questions were addressed. Maximal Sterile Barrier Technique was utilized including caps, mask, sterile gowns, sterile gloves, sterile drape, hand hygiene and skin antiseptic. A timeout was performed prior to the initiation of the procedure. The pre-existing left-sided abdominal drainage catheter was injected with contrast material and fluoroscopic images obtained. The catheter was then prepped and draped. The pre-existing catheter was cut and removed over a guidewire. The guidewire was then redirected inferiorly utilizing a 5 [place] Kumpe catheter. A new 14 French pigtail drainage catheter was then advanced over the wire. Catheter position was confirmed by fluoroscopy. The catheter was flushed and attached to a suction bulb. The catheter was secured at the skin with a Prolene retention suture and StatLock device. PROCEDURE: With injection of the left-sided abdominal drainage catheter, there is evidence of inferior extension of contrast  into the elongated abscess cavity that extends all the way to the midline pelvis. This correlates with the CT finding of extension of fluid inferiorly into the anterior pelvis. A new drainage  catheter was redirected into the inferior abscess cavity. IMPRESSION: Injection of the pre-existing left-sided abdominal drainage catheter demonstrates inferior extension of contrast into an elongated residual abscess cavity extending into the pelvis. A new 14 French drainage catheter was redirected over a guidewire into the inferior abscess cavity. Electronically Signed   By: Irish Lack M.D.   On: 06/13/2016 11:07   Ir Catheter Tube Change  Result Date: 06/13/2016 INDICATION: Status post percutaneous drainage of both right and left intraabdominal abscess fluid collections at outside hospital related to peritonitis from prior peritoneal dialysis. The right-sided abdominal drain was exchanged yesterday and re- directed inferiorly into a residual abscess cavity. Similarly, recent CT demonstrates persistent fluid cavity inferior to the left-sided abdominal drain and injection with potential drain exchange/re- direction is now performed on the left. EXAM: SINUS TRACT INJECTION / FISTULOGRAM ABSCESS DRAINAGE CATHETER EXCHANGE COMPARISON:  CT of the abdomen and pelvis on 06/09/2016 MEDICATIONS: No medications administered during the procedure. ANESTHESIA/SEDATION: None CONTRAST:  10 mL Isovue-300 FLUOROSCOPY TIME:  2 minutes and 24 seconds.  13 mGy. COMPLICATIONS: None immediate. TECHNIQUE: Informed written consent was obtained from the patient after a thorough discussion of the procedural risks, benefits and alternatives. All questions were addressed. Maximal Sterile Barrier Technique was utilized including caps, mask, sterile gowns, sterile gloves, sterile drape, hand hygiene and skin antiseptic. A timeout was performed prior to the initiation of the procedure. The pre-existing left-sided abdominal drainage catheter was injected with contrast material and fluoroscopic images obtained. The catheter was then prepped and draped. The pre-existing catheter was cut and removed over a guidewire. The guidewire was then  redirected inferiorly utilizing a 5 [place] Kumpe catheter. A new 14 French pigtail drainage catheter was then advanced over the wire. Catheter position was confirmed by fluoroscopy. The catheter was flushed and attached to a suction bulb. The catheter was secured at the skin with a Prolene retention suture and StatLock device. PROCEDURE: With injection of the left-sided abdominal drainage catheter, there is evidence of inferior extension of contrast into the elongated abscess cavity that extends all the way to the midline pelvis. This correlates with the CT finding of extension of fluid inferiorly into the anterior pelvis. A new drainage catheter was redirected into the inferior abscess cavity. IMPRESSION: Injection of the pre-existing left-sided abdominal drainage catheter demonstrates inferior extension of contrast into an elongated residual abscess cavity extending into the pelvis. A new 14 French drainage catheter was redirected over a guidewire into the inferior abscess cavity. Electronically Signed   By: Irish Lack M.D.   On: 06/13/2016 11:07     Medications:     Assessment/ Plan:  41 y.o.White  male with ESRD, on dialysis for 4 years, DM-2, HTN, severe PVD, was admitted on 05/07/2016. Presented with rt tibia fib fracture. Conservative management. Hospital course complicated by intra-abdominal abscess; PD catheter has been removed  1. ESRD- now HD; MWF 2. AOCKD, 3. SHPTH,  on renvela 2400 TID  4. DM-2 with CKD 5. HTN    Plan: Patient is continued on hemodialysis schedule. 700 cc fluid was removed Continued on amlodipine 10 mg daily, hydralazine 75 three times a day and 25 twice a day, labetalol 200 mg twice a day, losartan 50 mg daily for blood pressure sevelamer 2400 mg 3 times a day for phosphorus  for binding Aranesp 60 mcg q. Wednesday for anemia. Recieved one unit of blood transfusion with dialysis Next hemodialysis planned for Friday   LOS: 0 Demarkis Gheen 5/16/20184:24  PM

## 2016-06-15 LAB — TYPE AND SCREEN
ABO/RH(D): O POS
Antibody Screen: NEGATIVE
UNIT DIVISION: 0
Unit division: 0

## 2016-06-15 LAB — BPAM RBC
Blood Product Expiration Date: 201805202359
Blood Product Expiration Date: 201805292359
ISSUE DATE / TIME: 201805141710
ISSUE DATE / TIME: 201805160937
Unit Type and Rh: 5100
Unit Type and Rh: 5100

## 2016-06-15 NOTE — Progress Notes (Signed)
Referring Physician(s):  Dr. Sharyon Medicus  Supervising Physician: Irish Lack  Patient Status:  [facility] - In-pt  Chief Complaint:  2 Abd/Pelvic drains place in outside facility, both drain manipulated in IR earlier this week  Subjective: Lethargic.  Open eyes but quickly falls asleep.   Allergies: No allergies on file  Medications: Prior to Admission medications   Not on File    Vital Signs: BP (!) 148/94   Pulse 71   Temp 97.8 F (36.6 C)   Resp (!) 25   SpO2 100%   Physical Exam  Lethargic, NAD Drain sites intact without erythema.  Both drains with sero-sangiunous and some purulent output.  Appears to be functioning well and good output.   Imaging: Ir Sinus/fist Tube Chk-non Gi  Result Date: 06/13/2016 INDICATION: Status post percutaneous drainage of both right and left intraabdominal abscess fluid collections at outside hospital related to peritonitis from prior peritoneal dialysis. The right-sided abdominal drain was exchanged yesterday and re- directed inferiorly into a residual abscess cavity. Similarly, recent CT demonstrates persistent fluid cavity inferior to the left-sided abdominal drain and injection with potential drain exchange/re- direction is now performed on the left. EXAM: SINUS TRACT INJECTION / FISTULOGRAM ABSCESS DRAINAGE CATHETER EXCHANGE COMPARISON:  CT of the abdomen and pelvis on 06/09/2016 MEDICATIONS: No medications administered during the procedure. ANESTHESIA/SEDATION: None CONTRAST:  10 mL Isovue-300 FLUOROSCOPY TIME:  2 minutes and 24 seconds.  13 mGy. COMPLICATIONS: None immediate. TECHNIQUE: Informed written consent was obtained from the patient after a thorough discussion of the procedural risks, benefits and alternatives. All questions were addressed. Maximal Sterile Barrier Technique was utilized including caps, mask, sterile gowns, sterile gloves, sterile drape, hand hygiene and skin antiseptic. A timeout was performed prior to the initiation of  the procedure. The pre-existing left-sided abdominal drainage catheter was injected with contrast material and fluoroscopic images obtained. The catheter was then prepped and draped. The pre-existing catheter was cut and removed over a guidewire. The guidewire was then redirected inferiorly utilizing a 5 [place] Kumpe catheter. A new 14 French pigtail drainage catheter was then advanced over the wire. Catheter position was confirmed by fluoroscopy. The catheter was flushed and attached to a suction bulb. The catheter was secured at the skin with a Prolene retention suture and StatLock device. PROCEDURE: With injection of the left-sided abdominal drainage catheter, there is evidence of inferior extension of contrast into the elongated abscess cavity that extends all the way to the midline pelvis. This correlates with the CT finding of extension of fluid inferiorly into the anterior pelvis. A new drainage catheter was redirected into the inferior abscess cavity. IMPRESSION: Injection of the pre-existing left-sided abdominal drainage catheter demonstrates inferior extension of contrast into an elongated residual abscess cavity extending into the pelvis. A new 14 French drainage catheter was redirected over a guidewire into the inferior abscess cavity. Electronically Signed   By: Irish Lack M.D.   On: 06/13/2016 11:07   Ir Catheter Tube Change  Result Date: 06/13/2016 INDICATION: Status post percutaneous drainage of both right and left intraabdominal abscess fluid collections at outside hospital related to peritonitis from prior peritoneal dialysis. The right-sided abdominal drain was exchanged yesterday and re- directed inferiorly into a residual abscess cavity. Similarly, recent CT demonstrates persistent fluid cavity inferior to the left-sided abdominal drain and injection with potential drain exchange/re- direction is now performed on the left. EXAM: SINUS TRACT INJECTION / FISTULOGRAM ABSCESS DRAINAGE  CATHETER EXCHANGE COMPARISON:  CT of the abdomen and pelvis  on 06/09/2016 MEDICATIONS: No medications administered during the procedure. ANESTHESIA/SEDATION: None CONTRAST:  10 mL Isovue-300 FLUOROSCOPY TIME:  2 minutes and 24 seconds.  13 mGy. COMPLICATIONS: None immediate. TECHNIQUE: Informed written consent was obtained from the patient after a thorough discussion of the procedural risks, benefits and alternatives. All questions were addressed. Maximal Sterile Barrier Technique was utilized including caps, mask, sterile gowns, sterile gloves, sterile drape, hand hygiene and skin antiseptic. A timeout was performed prior to the initiation of the procedure. The pre-existing left-sided abdominal drainage catheter was injected with contrast material and fluoroscopic images obtained. The catheter was then prepped and draped. The pre-existing catheter was cut and removed over a guidewire. The guidewire was then redirected inferiorly utilizing a 5 [place] Kumpe catheter. A new 14 French pigtail drainage catheter was then advanced over the wire. Catheter position was confirmed by fluoroscopy. The catheter was flushed and attached to a suction bulb. The catheter was secured at the skin with a Prolene retention suture and StatLock device. PROCEDURE: With injection of the left-sided abdominal drainage catheter, there is evidence of inferior extension of contrast into the elongated abscess cavity that extends all the way to the midline pelvis. This correlates with the CT finding of extension of fluid inferiorly into the anterior pelvis. A new drainage catheter was redirected into the inferior abscess cavity. IMPRESSION: Injection of the pre-existing left-sided abdominal drainage catheter demonstrates inferior extension of contrast into an elongated residual abscess cavity extending into the pelvis. A new 14 French drainage catheter was redirected over a guidewire into the inferior abscess cavity. Electronically Signed   By:  Irish Lack M.D.   On: 06/13/2016 11:07   Ir Catheter Tube Change  Result Date: 06/12/2016 INDICATION: Right abdominal abscess strain manipulation EXAM: ABSCESS DRAIN EXCHANGE MEDICATIONS: The patient is currently admitted to the hospital and receiving intravenous antibiotics. The antibiotics were administered within an appropriate time frame prior to the initiation of the procedure. ANESTHESIA/SEDATION: None. COMPLICATIONS: None immediate. PROCEDURE: Informed written consent was obtained from the patient after a thorough discussion of the procedural risks, benefits and alternatives. All questions were addressed. Maximal Sterile Barrier Technique was utilized including caps, mask, sterile gowns, sterile gloves, sterile drape, hand hygiene and skin antiseptic. A timeout was performed prior to the initiation of the procedure. The right abdominal drain insertion site was prepped and draped in a sterile fashion. 1% lidocaine was utilized for local anesthesia. Contrast was injected into the existing drain. It was cut and exchanged over a Bentson wire for a copy catheter. The Kumpe be catheter was advanced over the Bentson wire into the inferior abscess cavity and removed over an Amplatz wire. A 14 French drain was advanced over the Amplatz wire into the more inferior abscess cavity. Contrast was injected. Frank pus was aspirated. It was sewn to the skin. FINDINGS: Imaging demonstrates exchange of the drain into a more inferior position at the site of the residual abscess. IMPRESSION: Successful right abdominal drain exchange with positioning of the drain inferiorly into the known adjacent abscess. Electronically Signed   By: Jolaine Click M.D.   On: 06/12/2016 16:17    Labs:  CBC:  Recent Labs  06/09/16 0805 06/11/16 0533 06/12/16 0809 06/13/16 1623 06/14/16 0710  WBC 11.8* 14.8* 10.9*  --  15.1*  HGB 7.5* 7.3* 6.2* 7.1* 6.2*  HCT 23.6* 22.3* 18.5* 21.4* 19.4*  PLT 275 263 199  --  220     COAGS:  Recent Labs  05/11/16 0617 06/02/16 1191  INR 1.18 1.28  APTT  --  49*    BMP:  Recent Labs  06/07/16 0753 06/09/16 0805 06/12/16 0809 06/14/16 0710  NA 132* 131* 131* 130*  K 5.0 4.5 4.5 5.3*  CL 96* 95* 92* 95*  CO2 25 26 25 23   GLUCOSE 219* 146* 82 67  BUN 34* 54* 51* 55*  CALCIUM 8.8* 9.2 8.4* 9.0  CREATININE 3.34* 3.89* 3.82* 4.00*  GFRNONAA 21* 18* 18* 17*  GFRAA 25* 21* 21* 20*    LIVER FUNCTION TESTS:  Recent Labs  05/11/16 0617  06/07/16 0753 06/09/16 0805 06/12/16 0809 06/14/16 0710  BILITOT 0.3  --   --   --   --   --   AST 26  --   --   --   --   --   ALT 13*  --   --   --   --   --   ALKPHOS 181*  --   --   --   --   --   PROT 6.3*  --   --   --   --   --   ALBUMIN 1.5*  < > 1.3* 1.3* 1.3* 1.2*  < > = values in this interval not displayed.  Assessment and Plan: Abd/Pelvic abscess drain  Drain intact after recent repositioning and manipulation.  Both drains with purulent material in JP bulbs.  WBC elevated. Continue routine drain care.  IR to follow.  Electronically Signed: Hoyt KochKacie Sue-Ellen Adell Koval, PA 06/15/2016, 3:05 PM   I spent a total of 15 Minutes at the the patient's bedside AND on the patient's hospital floor or unit, greater than 50% of which was counseling/coordinating care for abscess drain

## 2016-06-16 ENCOUNTER — Institutional Professional Consult (permissible substitution) (HOSPITAL_COMMUNITY): Payer: Medicaid Other

## 2016-06-16 ENCOUNTER — Encounter: Payer: Medicaid Other | Admitting: Certified Registered"

## 2016-06-16 LAB — CBC
HEMATOCRIT: 21.6 % — AB (ref 39.0–52.0)
HEMOGLOBIN: 6.8 g/dL — AB (ref 13.0–17.0)
MCH: 27.6 pg (ref 26.0–34.0)
MCHC: 31.5 g/dL (ref 30.0–36.0)
MCV: 87.8 fL (ref 78.0–100.0)
Platelets: 173 10*3/uL (ref 150–400)
RBC: 2.46 MIL/uL — ABNORMAL LOW (ref 4.22–5.81)
RDW: 18.6 % — ABNORMAL HIGH (ref 11.5–15.5)
WBC: 13.8 10*3/uL — ABNORMAL HIGH (ref 4.0–10.5)

## 2016-06-16 LAB — BLOOD GAS, ARTERIAL
Acid-base deficit: 0.9 mmol/L (ref 0.0–2.0)
Bicarbonate: 23.5 mmol/L (ref 20.0–28.0)
FIO2: 100
MECHVT: 400 mL
O2 Saturation: 99.5 %
PEEP: 5 cmH2O
Patient temperature: 90.3
RATE: 16 resp/min
pCO2 arterial: 32.4 mmHg (ref 32.0–48.0)
pH, Arterial: 7.448 (ref 7.350–7.450)
pO2, Arterial: 162 mmHg — ABNORMAL HIGH (ref 83.0–108.0)

## 2016-06-16 LAB — RENAL FUNCTION PANEL
ANION GAP: 11 (ref 5–15)
Albumin: 1 g/dL — ABNORMAL LOW (ref 3.5–5.0)
BUN: 52 mg/dL — ABNORMAL HIGH (ref 6–20)
CHLORIDE: 91 mmol/L — AB (ref 101–111)
CO2: 24 mmol/L (ref 22–32)
Calcium: 8.7 mg/dL — ABNORMAL LOW (ref 8.9–10.3)
Creatinine, Ser: 3.33 mg/dL — ABNORMAL HIGH (ref 0.61–1.24)
GFR calc Af Amer: 25 mL/min — ABNORMAL LOW (ref >60)
GFR calc non Af Amer: 21 mL/min — ABNORMAL LOW (ref >60)
GLUCOSE: 558 mg/dL — AB (ref 65–99)
POTASSIUM: 5.3 mmol/L — AB (ref 3.5–5.1)
Phosphorus: 7.7 mg/dL — ABNORMAL HIGH (ref 2.5–4.6)
Sodium: 126 mmol/L — ABNORMAL LOW (ref 135–145)

## 2016-06-16 LAB — PREPARE RBC (CROSSMATCH)

## 2016-06-16 NOTE — Progress Notes (Signed)
  Subjective:  Patient is critically ill today. Has to be intubated early this morning CPR done. Found to have hypoglycemia CXR suggests pulm edema Patient is hypothermic and is currently on warming blanket. Levophed at 15 mcg  Objective:  Vital signs in last 24 hours:  Temperature  90.3, pulse 51, respirations 24, blood pressure 121/40 Vent fio2 100 %, PEEP 5   Physical Exam: General: NAD, thin cachectic  HEENT Mild periorbital edema  Neck Supple  Pulm/lungs Vent assisted  CVS/Heart Regular, ntachycardic  Abdomen:  Firm, mild distension, abdominal drains in place  Extremities: Rt leg bandaged, trace edema LLE, necrotic area left thigh  Neurologic: Opens eyes, not following commands  Skin: Necrotic finger tip right 3rd finger. Left thigh necrotic area  Access: Rt IJ PC       Basic Metabolic Panel:   Recent Labs Lab 06/12/16 0809 06/14/16 0710 06/16/16 0429  NA 131* 130* 126*  K 4.5 5.3* 5.3*  CL 92* 95* 91*  CO2 25 23 24   GLUCOSE 82 67 558*  BUN 51* 55* 52*  CREATININE 3.82* 4.00* 3.33*  CALCIUM 8.4* 9.0 8.7*  PHOS 6.8* 7.6* 7.7*     CBC:  Recent Labs Lab 06/11/16 0533 06/12/16 0809 06/13/16 1623 06/14/16 0710 06/16/16 0429  WBC 14.8* 10.9*  --  15.1* 13.8*  HGB 7.3* 6.2* 7.1* 6.2* 6.8*  HCT 22.3* 18.5* 21.4* 19.4* 21.6*  MCV 87.5 86.9  --  87.0 87.8  PLT 263 199  --  220 173      Microbiology:  No results found for this or any previous visit (from the past 720 hour(s)).  Coagulation Studies: No results for input(s): LABPROT, INR in the last 72 hours.  Urinalysis: No results for input(s): COLORURINE, LABSPEC, PHURINE, GLUCOSEU, HGBUR, BILIRUBINUR, KETONESUR, PROTEINUR, UROBILINOGEN, NITRITE, LEUKOCYTESUR in the last 72 hours.  Invalid input(s): APPERANCEUR    Imaging: Dg Chest Port 1 View  Result Date: 06/16/2016 CLINICAL DATA:  Intubation after cardiac arrest EXAM: PORTABLE CHEST 1 VIEW COMPARISON:  None. FINDINGS: There is an  endotracheal tube with tip at the level of the clavicular heads. Right IJ hemodialysis catheter tip is at the cavoatrial junction. There are diffuse bilateral confluent opacities, suggesting pulmonary edema. There is mild aortic atherosclerosis. IMPRESSION: 1. Endotracheal tube tip in appropriate position. 2. Moderate pulmonary edema. Electronically Signed   By: Deatra RobinsonKevin  Herman M.D.   On: 06/16/2016 05:01     Medications:     Assessment/ Plan:  41 y.o.White  male with ESRD, on dialysis for 4 years, DM-2, HTN, severe PVD, was admitted on 09/26/16. Presented with rt tibia fib fracture. Conservative management. Hospital course complicated by intra-abdominal abscess; PD catheter has been removed  1. ESRD- now HD; MWF 2. Anemia of CKD, 3. SHPTH,    4. DM-2 with CKD 5. HTN 6. Acute resp failure  Plan: Patient is continued on hemodialysis. Scheduled for treatment later today. Treatment time shortened to 3 hrs Blood transfusion to be given with HD Hold BP meds since patient is requiring pressors Aranesp 60 mcg q. Wednesday for anemia.   Next hemodialysis planned for Monday   LOS: 0 Godfrey Tritschler 5/18/20189:06 AM

## 2016-06-16 NOTE — Anesthesia Procedure Notes (Signed)
Procedure Name: Intubation Date/Time: 06/16/2016 4:13 AM Performed by: Manuela Schwartz B Oxygen Delivery Method: Ambu bag Laryngoscope Size: Mac and 3 Grade View: Grade I Tube type: Subglottic suction tube Tube size: 7.5 mm Number of attempts: 2 (one attempt by RT resulting in esophageal intubation) Airway Equipment and Method: Stylet Placement Confirmation: ETT inserted through vocal cords under direct vision,  CO2 detector and breath sounds checked- equal and bilateral Secured at: 24 cm Tube secured with: Tape Dental Injury: Teeth and Oropharynx as per pre-operative assessment

## 2016-06-17 ENCOUNTER — Other Ambulatory Visit (HOSPITAL_COMMUNITY): Payer: Medicaid Other

## 2016-06-17 LAB — BLOOD GAS, ARTERIAL
Acid-base deficit: 2.2 mmol/L — ABNORMAL HIGH (ref 0.0–2.0)
BICARBONATE: 22.5 mmol/L (ref 20.0–28.0)
FIO2: 30
LHR: 16 {breaths}/min
O2 Saturation: 84.3 %
PEEP: 5 cmH2O
Patient temperature: 98.6
VT: 400 mL
pCO2 arterial: 41.1 mmHg (ref 32.0–48.0)
pH, Arterial: 7.357 (ref 7.350–7.450)
pO2, Arterial: 53.3 mmHg — ABNORMAL LOW (ref 83.0–108.0)

## 2016-06-17 LAB — TYPE AND SCREEN
ABO/RH(D): O POS
Antibody Screen: NEGATIVE
Unit division: 0

## 2016-06-17 LAB — BPAM RBC
Blood Product Expiration Date: 201806122359
ISSUE DATE / TIME: 201805181352
Unit Type and Rh: 5100

## 2016-06-18 ENCOUNTER — Other Ambulatory Visit (HOSPITAL_COMMUNITY): Payer: Medicaid Other

## 2016-06-18 LAB — BLOOD GAS, ARTERIAL
ACID-BASE DEFICIT: 7.3 mmol/L — AB (ref 0.0–2.0)
Bicarbonate: 18.6 mmol/L — ABNORMAL LOW (ref 20.0–28.0)
FIO2: 40
MECHVT: 400 mL
O2 Saturation: 92.7 %
PEEP/CPAP: 5 cmH2O
Patient temperature: 96.3
RATE: 16 resp/min
pCO2 arterial: 40.8 mmHg (ref 32.0–48.0)
pH, Arterial: 7.273 — ABNORMAL LOW (ref 7.350–7.450)
pO2, Arterial: 70.6 mmHg — ABNORMAL LOW (ref 83.0–108.0)

## 2016-06-18 MED FILL — Medication: Qty: 1 | Status: AC

## 2016-06-19 ENCOUNTER — Other Ambulatory Visit (HOSPITAL_COMMUNITY): Payer: Medicaid Other

## 2016-06-19 LAB — CBC
HCT: 22.9 % — ABNORMAL LOW (ref 39.0–52.0)
Hemoglobin: 7.2 g/dL — ABNORMAL LOW (ref 13.0–17.0)
MCH: 27.2 pg (ref 26.0–34.0)
MCHC: 31.4 g/dL (ref 30.0–36.0)
MCV: 86.4 fL (ref 78.0–100.0)
Platelets: 147 10*3/uL — ABNORMAL LOW (ref 150–400)
RBC: 2.65 MIL/uL — ABNORMAL LOW (ref 4.22–5.81)
RDW: 16.6 % — ABNORMAL HIGH (ref 11.5–15.5)
WBC: 21.6 10*3/uL — ABNORMAL HIGH (ref 4.0–10.5)

## 2016-06-19 LAB — BLOOD GAS, ARTERIAL
Acid-base deficit: 9.2 mmol/L — ABNORMAL HIGH (ref 0.0–2.0)
Bicarbonate: 17.9 mmol/L — ABNORMAL LOW (ref 20.0–28.0)
FIO2: 0.5
MECHVT: 400 mL
O2 Saturation: 86.5 %
Patient temperature: 98.6
RATE: 24 resp/min
pCO2 arterial: 50.8 mmHg — ABNORMAL HIGH (ref 32.0–48.0)
pH, Arterial: 7.172 — CL (ref 7.350–7.450)
pO2, Arterial: 64.8 mmHg — ABNORMAL LOW (ref 83.0–108.0)

## 2016-06-19 LAB — MAGNESIUM: Magnesium: 2.2 mg/dL (ref 1.7–2.4)

## 2016-06-19 LAB — BASIC METABOLIC PANEL
Anion gap: 17 — ABNORMAL HIGH (ref 5–15)
BUN: 59 mg/dL — ABNORMAL HIGH (ref 6–20)
CO2: 18 mmol/L — ABNORMAL LOW (ref 22–32)
Calcium: 8.9 mg/dL (ref 8.9–10.3)
Chloride: 81 mmol/L — ABNORMAL LOW (ref 101–111)
Creatinine, Ser: 3.41 mg/dL — ABNORMAL HIGH (ref 0.61–1.24)
GFR calc Af Amer: 24 mL/min — ABNORMAL LOW (ref >60)
GFR calc non Af Amer: 21 mL/min — ABNORMAL LOW (ref >60)
Glucose, Bld: 158 mg/dL — ABNORMAL HIGH (ref 65–99)
Potassium: 5.8 mmol/L — ABNORMAL HIGH (ref 3.5–5.1)
Sodium: 116 mmol/L — CL (ref 135–145)

## 2016-06-19 LAB — CULTURE, RESPIRATORY W GRAM STAIN

## 2016-06-19 LAB — CULTURE, RESPIRATORY: CULTURE: NORMAL

## 2016-06-19 LAB — TROPONIN I: Troponin I: 1.19 ng/mL (ref <0.03)

## 2016-06-24 MED FILL — Medication: Qty: 1 | Status: AC

## 2016-06-30 DEATH — deceased

## 2016-07-28 ENCOUNTER — Encounter: Payer: Self-pay | Admitting: Vascular Surgery

## 2019-05-01 IMAGING — CT CT ABD-PELV W/ CM
2 of 5 series · 14 of 46 positions shown, 16 images · IV contrast (Omni 300)
Comparison: Most recent prior CT scan of the abdomen and pelvis
06/01/2006 teen

CLINICAL DATA: 40-year-old male with a history of abdominal and
pelvic abscesses treated by percutaneous drainage at an outside
institution. Patient is currently admitted to the [REDACTED] here at [HOSPITAL]. Evaluate for drain position and
residual abscess collections.

EXAM:
CT ABDOMEN AND PELVIS WITH CONTRAST
TECHNIQUE: Multidetector CT imaging of the abdomen and pelvis was performed
using the standard protocol following bolus administration of
intravenous contrast.
CONTRAST:  100mL [id number] IOPAMIDOL ([id number]) INJECTION 61%

[Series 3: a/p w/ 5mm · axial · 0.76mm/px · z∈[-397,+28]mm · 11 of 97 slices shown, 13 images]
[im 6/97  soft-tissue]
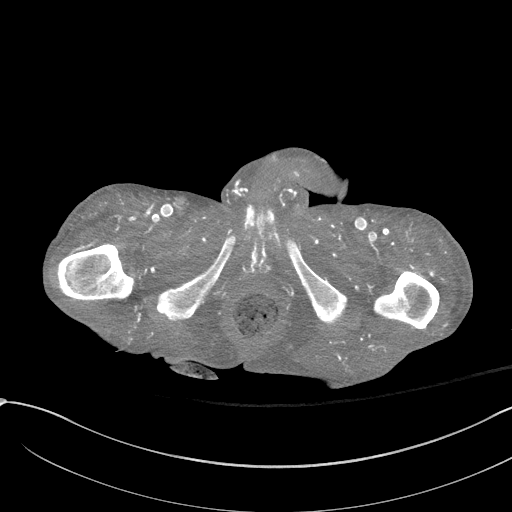
[im 6/97  bone]
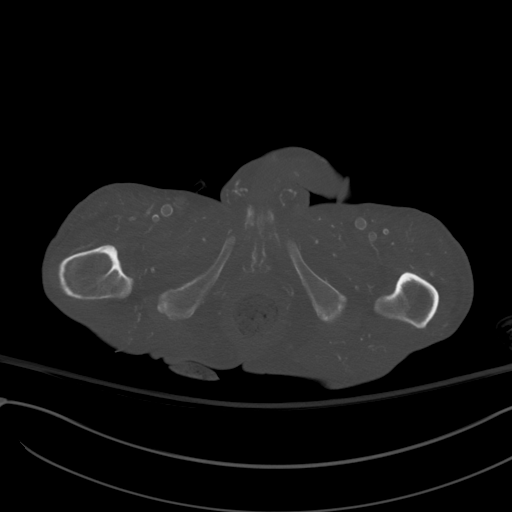
[im 16/97  soft-tissue]
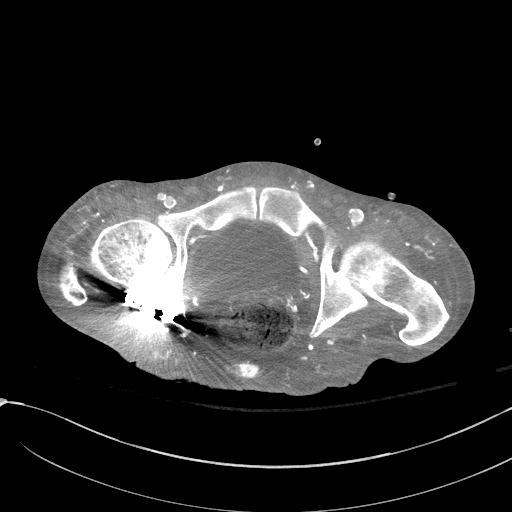
[im 26/97  soft-tissue]
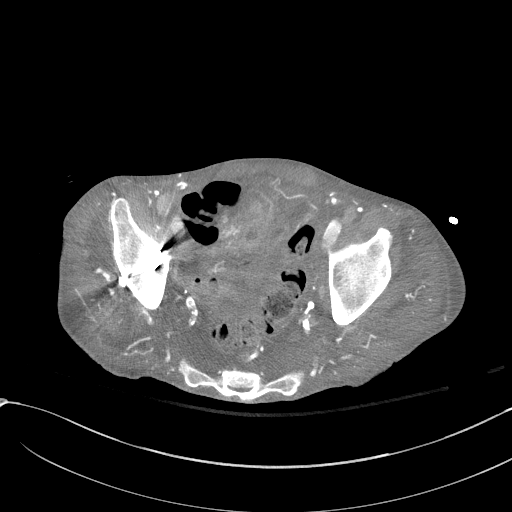
[im 31/97  soft-tissue]
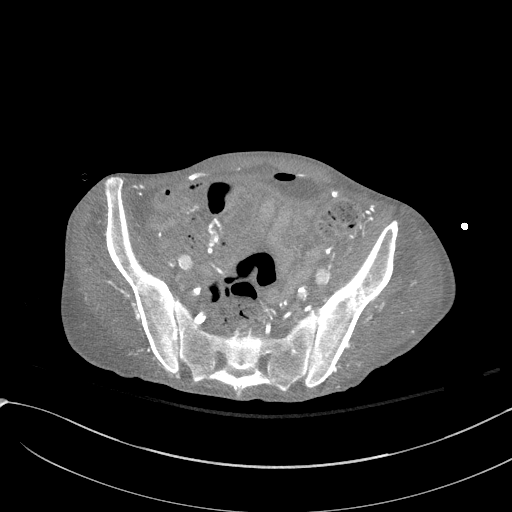
[im 41/97  soft-tissue]
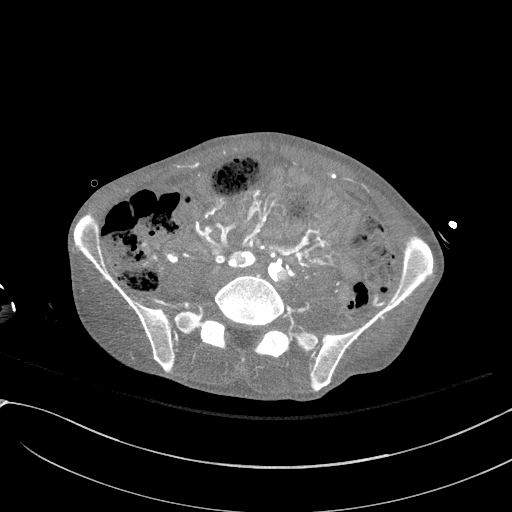
[im 51/97  soft-tissue]
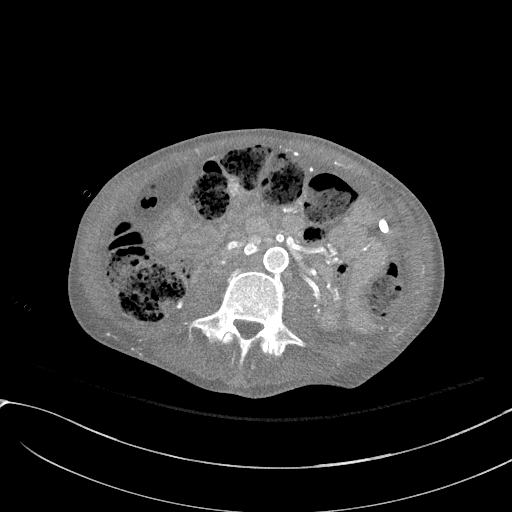
[im 56/97  soft-tissue]
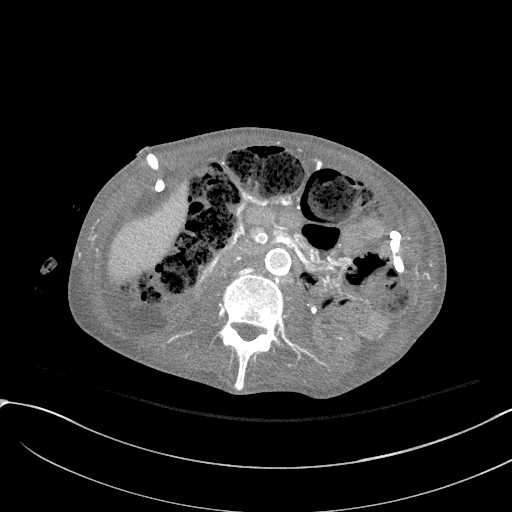
[im 66/97  soft-tissue]
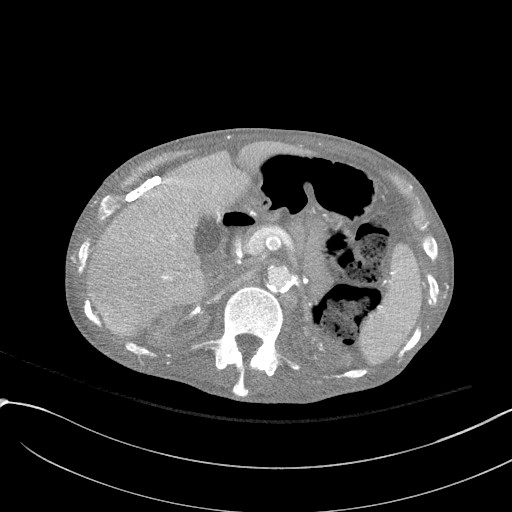
[im 71/97  soft-tissue]
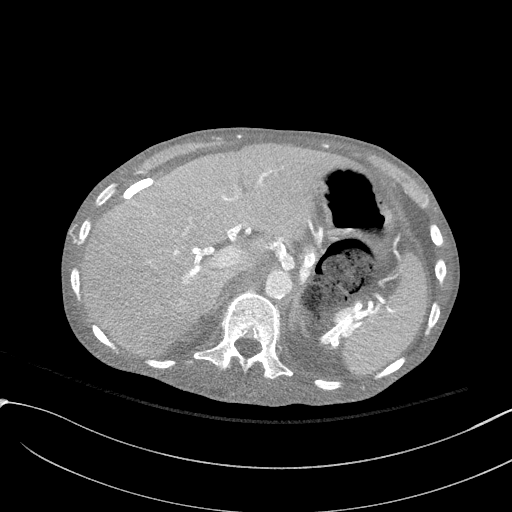
[im 71/97  bone]
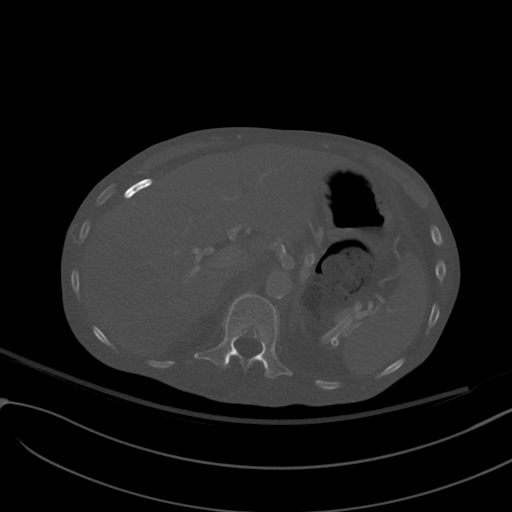
[im 81/97  soft-tissue]
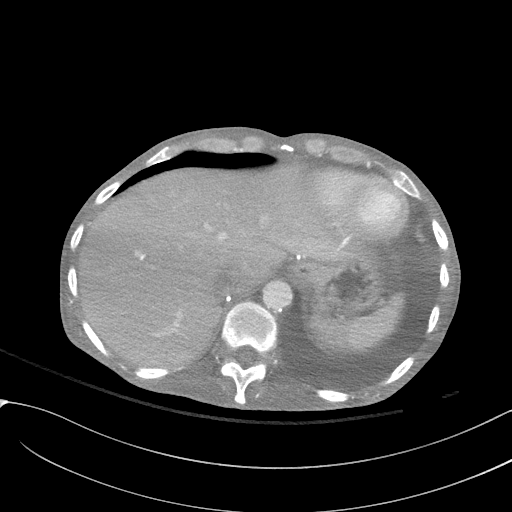
[im 91/97  soft-tissue]
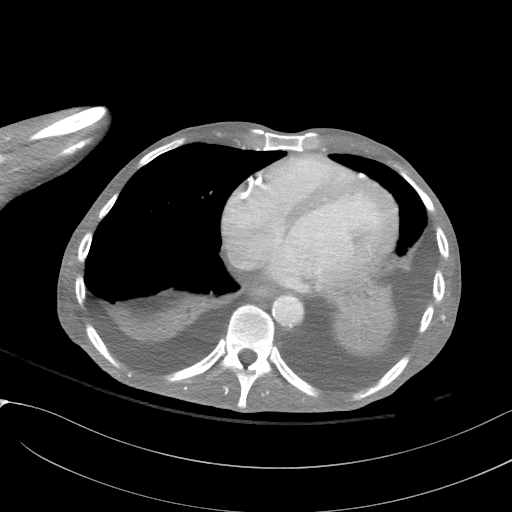

[Series 6: a/p w/ cor · coronal · 0.68mm/px · 3 of 151 slices shown]
[im 51/151  soft-tissue]
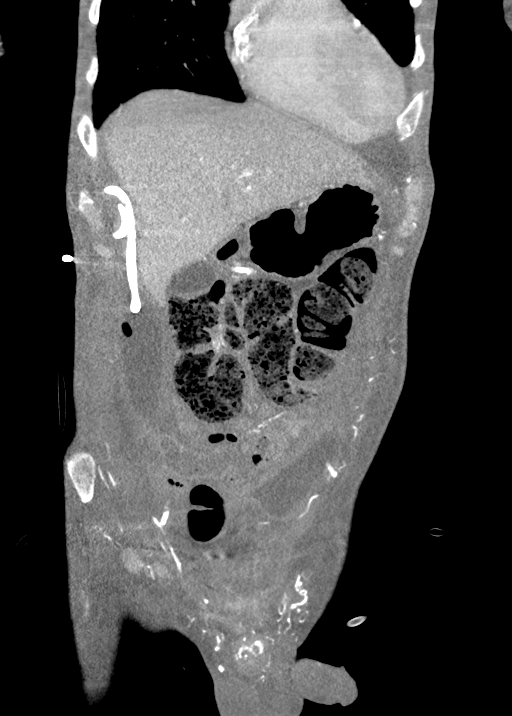
[im 67/151  soft-tissue]
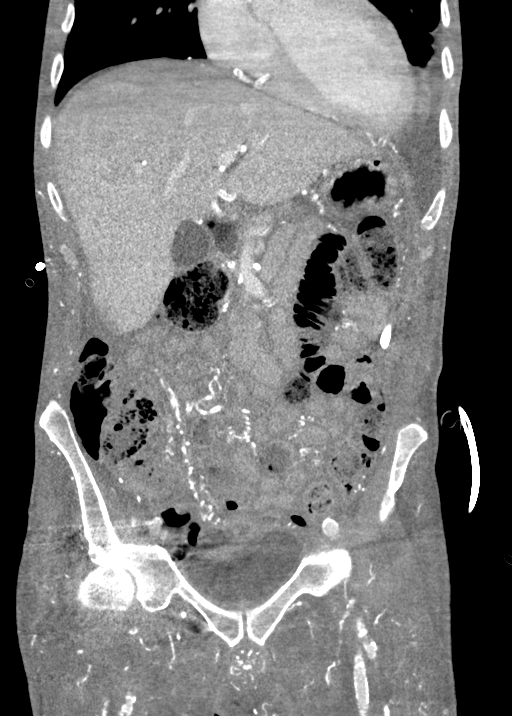
[im 84/151  soft-tissue]
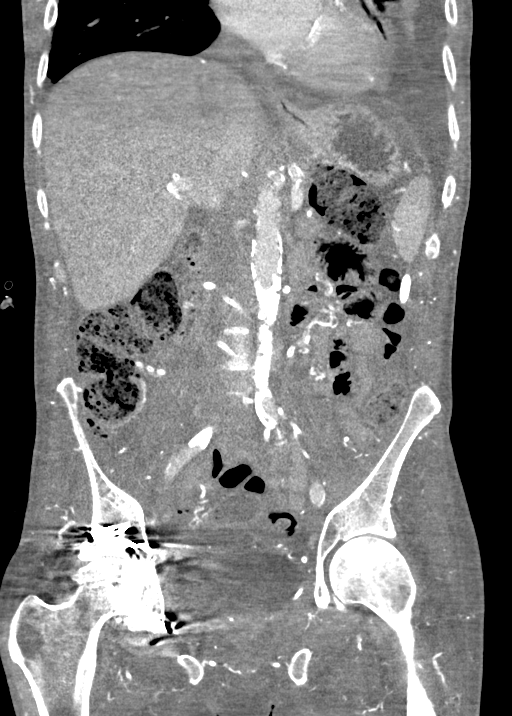

[14 of 46 positions shown; findings below may reference images not displayed]

FINDINGS: Lower chest: Similar size and volume of moderate right and large
left layering pleural effusions with associated lower lobe
atelectasis. Extensive calcifications along of the visualized
coronary arteries again noted. The heart remains normal in size. No
pericardial effusion. The non atelectatic portions of the lower
lungs are clear. Unremarkable distal thoracic esophagus.

Hepatobiliary: Normal hepatic contour and morphology. No discrete
hepatic lesions. Normal appearance of the gallbladder. No intra or
extrahepatic biliary ductal dilatation.

Pancreas: Atrophic.  No pancreatic mass identified.

Spleen: Normal in size without focal abnormality.

Adrenals/Urinary Tract: The native kidneys are severely atrophic and
contain multiple tiny low-attenuation lesions most consistent with
either a numerous small cysts, or potentially medullary sponge
kidney. There is a solitary 3 cm simple cyst exophytic from the
lower pole of the right kidney. The bladder contains urine.

Stomach/Bowel: No focal bowel wall thickening or evidence of
obstruction. Large colonic stool burden suggests constipation.

Vascular/Lymphatic: Severe medial arterial sclerosis.
Atherosclerotic calcifications also present in the abdominal aorta
and bilateral iliac arteries. There is likely a hemodynamically
significant stenosis in the right common iliac artery. No suspicious
lymphadenopathy.

Reproductive: The prostate gland is unremarkable.

Other: There is a percutaneous drainage catheter in the right upper
quadrant adjacent to the liver. No undrained fluid around the
pigtail of the drainage catheter. However, there is a persistent
fluid and gas collection in the right lower quadrant beginning at
the site of entry of the drainage catheter into the peritoneal space
which has slightly improved compared to prior presently measuring
6.0 x 2.4 x 7.3 cm compared 8.1 x 3.4 x 9.2 cm. The second drainage
catheter is located in the left upper quadrant just inferior to the
spleen. There is no undrained fluid collection about the loop of the
pigtail catheter. However, tracking inferiorly from the entry point
of the catheter into the peritoneal space there is a thin channel of
fluid which connects to a residual abscess just left of midline in
the low abdomen. This fluid collection is also slightly improved
compared to prior presently measuring approximately 3.5 by 2.4 x
11.1 cm compared to 4.4 x 2.8 x 13.6 cm. A final probable fluid
collection deep in the anatomic pelvis is also slightly improved at
3.7 x 2.2 cm compared to 4.5 x 3.1 cm previously. No new abscesses
or complex fluid collections identified.

Musculoskeletal: No acute osseous abnormality. Surgical changes of
prior ORIF of a right acetabular fracture. Changes of avascular
necrosis present in the right femoral head.
IMPRESSION: 1. While there has been interval improvement in all 3 previously
described abdominopelvic abscesses, the 2 dominant collections could
likely be further improved by repositioning the existing
percutaneous drainage catheters. The drainage catheters could
potentially be repositioned fluoroscopically into the more inferior
and dependent undrained portions of the fluid collections.
2. Persistent large left and moderate right layering pleural
effusions.
3. Significant atherosclerotic vascular disease and medial arterial
sclerosis.
4. Probable focal significant stenosis of the right common iliac
artery.
5. Changes of avascular necrosis in the right greater than left
femoral heads.
